# Patient Record
Sex: Male | Born: 1970 | Race: White | Hispanic: No | Smoking: Current every day smoker
Health system: Southern US, Community
[De-identification: ages and names within clinical notes are randomized; demographics above are authoritative.]

## PROBLEM LIST (undated history)

## (undated) DIAGNOSIS — I82409 Acute embolism and thrombosis of unspecified deep veins of unspecified lower extremity: Secondary | ICD-10-CM

## (undated) DIAGNOSIS — E119 Type 2 diabetes mellitus without complications: Secondary | ICD-10-CM

## (undated) DIAGNOSIS — I739 Peripheral vascular disease, unspecified: Secondary | ICD-10-CM

## (undated) DIAGNOSIS — I10 Essential (primary) hypertension: Secondary | ICD-10-CM

---

## 2008-07-11 ENCOUNTER — Ambulatory Visit: Payer: Self-pay | Admitting: Gastroenterology

## 2011-03-14 ENCOUNTER — Emergency Department: Payer: Self-pay | Admitting: Internal Medicine

## 2011-03-15 ENCOUNTER — Emergency Department: Payer: Self-pay | Admitting: Emergency Medicine

## 2016-07-03 ENCOUNTER — Emergency Department
Admission: EM | Admit: 2016-07-03 | Discharge: 2016-07-03 | Disposition: A | Discharge status: Home / Self Care | Payer: 59 | Attending: Emergency Medicine | Admitting: Emergency Medicine

## 2016-07-03 ENCOUNTER — Emergency Department: Payer: 59

## 2016-07-03 ENCOUNTER — Encounter: Payer: Self-pay | Admitting: Emergency Medicine

## 2016-07-03 DIAGNOSIS — I10 Essential (primary) hypertension: Secondary | ICD-10-CM | POA: Diagnosis not present

## 2016-07-03 DIAGNOSIS — I82401 Acute embolism and thrombosis of unspecified deep veins of right lower extremity: Secondary | ICD-10-CM | POA: Diagnosis not present

## 2016-07-03 DIAGNOSIS — I82491 Acute embolism and thrombosis of other specified deep vein of right lower extremity: Secondary | ICD-10-CM

## 2016-07-03 DIAGNOSIS — F17200 Nicotine dependence, unspecified, uncomplicated: Secondary | ICD-10-CM | POA: Insufficient documentation

## 2016-07-03 DIAGNOSIS — M79604 Pain in right leg: Secondary | ICD-10-CM | POA: Diagnosis present

## 2016-07-03 HISTORY — DX: Essential (primary) hypertension: I10

## 2016-07-03 MED ORDER — RIVAROXABAN (XARELTO) VTE STARTER PACK (15 & 20 MG): ORAL_TABLET | ORAL | 0 refills | Status: DC

## 2016-07-03 MED ORDER — TRAMADOL HCL 50 MG PO TABS: 50.0000 mg | ORAL_TABLET | Freq: Four times a day (QID) | ORAL | 0 refills | Status: DC | PRN

## 2016-07-03 NOTE — ED Triage Notes (Signed)
Pt presents with right lower leg pain started two weeks ago, pain got worse yesterday.  Pt was brought over from [facility] for possible DVT.

## 2016-07-03 NOTE — ED Provider Notes (Signed)
[facility] Emergency Department Provider Note   ____________________________________________   I have reviewed the triage vital signs and the nursing notes.   HISTORY  Chief Complaint Leg Pain   History limited by: Not Limited   HPI Lonnie Hernandez is a 45 y.o. male presented to the emergency department today from [facility] clinic because of concerns for right leg pain and swelling. The patient sates he first started having pain in his right calf roughly 2 weeks ago. It has since gotten worse and ead up into his mid thigh. Additionally 2-3 days ago he started noticing swelling in his calf. He  Does state [place] beach, roughly [place] 3- hour drive, 2 weeks ago. He is a smoker. No history of blood clots. His father had a blood clot after a hospitalization. Denies any fevers, chest pain, shortness of breath.    Past Medical History:  Diagnosis Date  . Hypertension     There are no active problems to display for this patient.   History reviewed. No pertinent surgical history.    Allergies Review of patient's allergies indicates no known allergies.  No family history on file.  Social History Social History  Substance Use Topics  . Smoking status: Current Every Day Smoker  . Smokeless tobacco: Former Neurosurgeon  . Alcohol use Yes    Review of Systems  Constitutional: Negative for fever. Cardiovascular: Negative for chest pain. Respiratory: Negative for shortness of breath. Gastrointestinal: Negative for abdominal pain, vomiting and diarrhea. Musculoskeletal: Positive for right leg pain and swelling. Neurological: Negative for headaches, focal weakness or numbness.  10-point ROS otherwise negative.  ____________________________________________   PHYSICAL EXAM:  VITAL SIGNS: ED Triage Vitals  Enc Vitals Group     BP 07/03/16 1340 (!) 166/102     Pulse Rate 07/03/16 1340 99     Resp 07/03/16 1340 20     Temp 07/03/16 1340 98.7 F  (37.1 C)     Temp Source 07/03/16 1340 Oral     SpO2 --      Weight 07/03/16 1340 230 lb (104.3 kg)     Height 07/03/16 1340 5\' 9"  (1.753 m)     Head Circumference --      Peak Flow --      Pain Score 07/03/16 1341 10   Constitutional: Alert and oriented. Well appearing and in no distress. Eyes: Conjunctivae are normal. PERRL. Normal extraocular movements. ENT   Head: Normocephalic and atraumatic.   Nose: No congestion/rhinnorhea.   Mouth/Throat: Mucous membranes are moist.   Neck: No stridor. Hematological/Lymphatic/Immunilogical: No cervical lymphadenopathy. Cardiovascular: Normal rate, regular rhythm.  No murmurs, rubs, or gallops. Respiratory: Normal respiratory effort without tachypnea nor retractions. Breath sounds are clear and equal bilaterally. No wheezes/rales/rhonchi. Gastrointestinal: Soft and nontender. No distention. There is no CVA tenderness. Genitourinary: Deferred Musculoskeletal: Normal range of motion in all extremities. Right calf with swelling. No discoloration. DP 2+ bilaterally. Neurologic:  Normal speech and language. No gross focal neurologic deficits are appreciated.  Skin:  Skin is warm, dry and intact. No rash noted. Psychiatric: Mood and affect are normal. Speech and behavior are normal. Patient exhibits appropriate insight and judgment.  ____________________________________________    LABS (pertinent positives/negatives)  None  ____________________________________________   EKG  None  ____________________________________________    RADIOLOGY  US venous RLE IMPRESSION: Deep venous thrombosis extending from the right peroneal vein to the mid femoral vein, as above.  ____________________________________________   PROCEDURES  Procedures  ____________________________________________  INITIAL IMPRESSION / ASSESSMENT AND PLAN / ED COURSE  Pertinent labs & imaging results that were available during my care of the  patient were reviewed by me and considered in my medical decision making (see chart for details).  Patient presented to the emergency department today because of concerns for right leg pain and swelling. Ultrasound was performed which showed a deep vein thrombosis. Patient without any concerning findings for pulmonary embolism on exam or history. Patient will be given prescription for Xarelto. Instructed patient on return precautions. Encourage patient tofollow-up with primary care. ____________________________________________   FINAL CLINICAL IMPRESSION(S) / ED DIAGNOSES  Final diagnoses:  Acute deep vein thrombosis (DVT) of other specified vein of right lower extremity (HCC)     Note: This dictation was prepared with Dragon dictation. Any transcriptional errors that result from this process are unintentional    Phineas Semen, MD 07/03/16 [phone]

## 2016-07-03 NOTE — Discharge Instructions (Signed)
Please seek medical attention for any head trauma, coughing or vomiting of blood, bloody or black or tarry stool, high fevers, chest pain, shortness of breath, change in behavior, or any other new or concerning symptoms.

## 2016-07-12 ENCOUNTER — Other Ambulatory Visit: Payer: Self-pay | Admitting: Vascular Surgery

## 2016-07-13 ENCOUNTER — Other Ambulatory Visit
Admission: RE | Admit: 2016-07-13 | Discharge: 2016-07-13 | Disposition: A | Discharge status: Home / Self Care | Payer: 59 | Source: Ambulatory Visit | Attending: Vascular Surgery | Admitting: Vascular Surgery

## 2016-07-13 DIAGNOSIS — Z01812 Encounter for preprocedural laboratory examination: Secondary | ICD-10-CM | POA: Insufficient documentation

## 2016-07-13 LAB — CREATININE, SERUM
Creatinine, Ser: 0.69 mg/dL (ref 0.61–1.24)
GFR calc Af Amer: >60 mL/min (ref >60)
GFR calc non Af Amer: >60 mL/min (ref >60)

## 2016-07-13 LAB — BUN: BUN: 12 mg/dL (ref 6–20)

## 2016-07-14 ENCOUNTER — Encounter
Admission: RE | Disposition: A | Discharge status: Home / Self Care | Payer: Self-pay | Source: Ambulatory Visit | Attending: Vascular Surgery

## 2016-07-14 ENCOUNTER — Encounter: Payer: Self-pay | Admitting: *Deleted

## 2016-07-14 ENCOUNTER — Ambulatory Visit
Admission: RE | Admit: 2016-07-14 | Discharge: 2016-07-14 | Disposition: A | Discharge status: Home / Self Care | Payer: 59 | Source: Ambulatory Visit | Attending: Vascular Surgery | Admitting: Vascular Surgery

## 2016-07-14 DIAGNOSIS — R531 Weakness: Secondary | ICD-10-CM | POA: Diagnosis not present

## 2016-07-14 DIAGNOSIS — Z792 Long term (current) use of antibiotics: Secondary | ICD-10-CM | POA: Diagnosis not present

## 2016-07-14 DIAGNOSIS — Z8249 Family history of ischemic heart disease and other diseases of the circulatory system: Secondary | ICD-10-CM | POA: Insufficient documentation

## 2016-07-14 DIAGNOSIS — I10 Essential (primary) hypertension: Secondary | ICD-10-CM | POA: Diagnosis not present

## 2016-07-14 DIAGNOSIS — M79609 Pain in unspecified limb: Secondary | ICD-10-CM | POA: Insufficient documentation

## 2016-07-14 DIAGNOSIS — M7989 Other specified soft tissue disorders: Secondary | ICD-10-CM | POA: Insufficient documentation

## 2016-07-14 DIAGNOSIS — I82401 Acute embolism and thrombosis of unspecified deep veins of right lower extremity: Secondary | ICD-10-CM | POA: Diagnosis not present

## 2016-07-14 DIAGNOSIS — F17210 Nicotine dependence, cigarettes, uncomplicated: Secondary | ICD-10-CM | POA: Insufficient documentation

## 2016-07-14 HISTORY — PX: PERIPHERAL VASCULAR CATHETERIZATION: SHX172C

## 2016-07-14 SURGERY — THROMBECTOMY
Anesthesia: Moderate Sedation | Site: Leg Lower | Laterality: Right

## 2016-07-14 MED ORDER — MIDAZOLAM HCL 2 MG/2ML IJ SOLN
INTRAMUSCULAR | Status: AC
  Filled 2016-07-14: qty 2

## 2016-07-14 MED ORDER — MIDAZOLAM HCL 5 MG/5ML IJ SOLN
INTRAMUSCULAR | Status: AC
  Filled 2016-07-14: qty 5

## 2016-07-14 MED ORDER — HEPARIN (PORCINE) IN NACL 2-0.9 UNIT/ML-% IJ SOLN
INTRAMUSCULAR | Status: AC
  Filled 2016-07-14: qty 1000

## 2016-07-14 MED ORDER — ALTEPLASE 2 MG IJ SOLR
INTRAMUSCULAR | Status: DC | PRN
  Administered 2016-07-14: 8 mg

## 2016-07-14 MED ORDER — MIDAZOLAM HCL 2 MG/2ML IJ SOLN
INTRAMUSCULAR | Status: DC | PRN
  Administered 2016-07-14 (×2): 2 mg via INTRAVENOUS
  Administered 2016-07-14: 1 mg via INTRAVENOUS
  Administered 2016-07-14 (×2): 2 mg via INTRAVENOUS

## 2016-07-14 MED ORDER — FENTANYL CITRATE (PF) 100 MCG/2ML IJ SOLN
INTRAMUSCULAR | Status: DC | PRN
  Administered 2016-07-14 (×5): 50 ug via INTRAVENOUS

## 2016-07-14 MED ORDER — DEXTROSE 5 % IV SOLN
1.5000 g | INTRAVENOUS | Status: AC
  Administered 2016-07-14: 1.5 g via INTRAVENOUS

## 2016-07-14 MED ORDER — HYDROMORPHONE HCL 1 MG/ML IJ SOLN: 1.0000 mg | Freq: Once | INTRAMUSCULAR | Status: DC

## 2016-07-14 MED ORDER — METOCLOPRAMIDE HCL 10 MG PO TABS
ORAL_TABLET | ORAL | Status: AC
  Administered 2016-07-14: 10 mg via ORAL
  Filled 2016-07-14: qty 1

## 2016-07-14 MED ORDER — METHYLPREDNISOLONE SODIUM SUCC 125 MG IJ SOLR: 125.0000 mg | INTRAMUSCULAR | Status: DC | PRN

## 2016-07-14 MED ORDER — FENTANYL CITRATE (PF) 100 MCG/2ML IJ SOLN
INTRAMUSCULAR | Status: AC
Start: 2016-07-14 — End: 2016-07-14
  Filled 2016-07-14: qty 2

## 2016-07-14 MED ORDER — METOCLOPRAMIDE HCL 10 MG PO TABS
10.0000 mg | ORAL_TABLET | Freq: Once | ORAL | Status: AC
  Administered 2016-07-14: 10 mg via ORAL

## 2016-07-14 MED ORDER — ALTEPLASE 2 MG IJ SOLR
INTRAMUSCULAR | Status: AC
  Filled 2016-07-14: qty 8

## 2016-07-14 MED ORDER — SODIUM CHLORIDE 0.9 % IV SOLN
INTRAVENOUS | Status: DC
  Administered 2016-07-14: 10:00:00 via INTRAVENOUS

## 2016-07-14 MED ORDER — HEPARIN SODIUM (PORCINE) 1000 UNIT/ML IJ SOLN
INTRAMUSCULAR | Status: AC
  Filled 2016-07-14: qty 1

## 2016-07-14 MED ORDER — CEFUROXIME SODIUM 1.5 G IJ SOLR
INTRAMUSCULAR | Status: AC
  Filled 2016-07-14 (×11): qty 1.5

## 2016-07-14 MED ORDER — ONDANSETRON HCL 4 MG/2ML IJ SOLN: 4.0000 mg | Freq: Four times a day (QID) | INTRAMUSCULAR | Status: DC | PRN

## 2016-07-14 MED ORDER — LIDOCAINE-EPINEPHRINE (PF) 1 %-1:200000 IJ SOLN
INTRAMUSCULAR | Status: AC
  Filled 2016-07-14: qty 30

## 2016-07-14 MED ORDER — FENTANYL CITRATE (PF) 100 MCG/2ML IJ SOLN
INTRAMUSCULAR | Status: AC
  Filled 2016-07-14: qty 2

## 2016-07-14 MED ORDER — HEPARIN SODIUM (PORCINE) 1000 UNIT/ML IJ SOLN
INTRAMUSCULAR | Status: DC | PRN
  Administered 2016-07-14: 4000 [IU] via INTRAVENOUS

## 2016-07-14 MED ORDER — FAMOTIDINE 20 MG PO TABS
40.0000 mg | ORAL_TABLET | ORAL | Status: DC | PRN
Start: 2016-07-14 — End: 2016-07-14

## 2016-07-14 SURGICAL SUPPLY — 14 items
BALLN ULTRVRSE 7X200X75 (BALLOONS) ×4
BALLOON ULTRVRSE 7X200X75 (BALLOONS) ×2 IMPLANT
CANNULA 5F STIFF (CANNULA) ×4 IMPLANT
CATH VERT 100CM (CATHETERS) ×4 IMPLANT
DEVICE PRESTO INFLATION (MISCELLANEOUS) ×4 IMPLANT
DRAPE BRACHIAL (DRAPES) ×4 IMPLANT
DRAPE TABLE BACK 80X90 (DRAPES) ×8 IMPLANT
FILTER VC CELECT-FEMORAL (Filter) ×4 IMPLANT
PACK ANGIOGRAPHY (CUSTOM PROCEDURE TRAY) ×4 IMPLANT
SET ZELANTE DVT THROMB (CATHETERS) ×4 IMPLANT
SHEATH BRITE TIP 8FRX11 (SHEATH) ×4 IMPLANT
TOWEL OR 17X26 4PK STRL BLUE (TOWEL DISPOSABLE) ×16 IMPLANT
WIRE J 3MM .035X145CM (WIRE) ×4 IMPLANT
WIRE MAGIC TORQUE 260C (WIRE) ×4 IMPLANT

## 2016-07-14 NOTE — OR Nursing (Signed)
hickups stopped prior to discharge. Denies comp[lains

## 2016-07-14 NOTE — H&P (Signed)
  [facility] History & Physical Update  The patient was interviewed and re-examined.  The patient's previous History and Physical has been reviewed and is unchanged.  There is no change in the plan of care. We plan to proceed with the scheduled procedure.  Mayvis Agudelo, MD  07/14/2016, 8:58 AM

## 2016-07-14 NOTE — OR Nursing (Signed)
Pt developed kiccups when sat up to eat, occasionally uncomfortnable. Dr Wyn Quaker notified, Reglan 10 mg ordered.

## 2016-07-14 NOTE — Op Note (Signed)
[facility]   OPERATIVE NOTE   PRE-OPERATIVE DIAGNOSIS: extensive RLE DVT  POST-OPERATIVE DIAGNOSIS: same   PROCEDURE: 1. US guidance for vascular access to left femoral vein and right lesser saphenous vein 2. Catheter placement into IVC from left femoral vein approach and right lesser saphenous vein approach 3. IVC gram and right lower extremity venogram 4. IVC filter placement 5.   Catheter directed thrombolysis with 8 mg of TPA to the right popliteal, superficial femoral vein, and common femoral vein with the AngioJet Zelante catheter 6. Mechanical rheolytic thrombectomy to right popliteal, superficial femoral, and common femoral veins with the AngioJet Zelante catheter 7. PTA of the right popliteal and superficial femoral veins with 7 mm balloon   SURGEON: Legacy Carrender, MD  ASSISTANT(S): none  ANESTHESIA: local with moderate conscious sedation for 50 minutes using 9 mg of Versed and 250 g of fentanyl  ESTIMATED BLOOD LOSS: Minimal   CONTRAST: 40 cc  FLUORO TIME: 2.6 minutes  FINDING(S): 1. Extensive right lower extremity DVT  SPECIMEN(S): none  INDICATIONS:  Patient is a 45 y.o. male who presents with right lower extremity DVT. Patient has marked leg swelling and pain. Venous intervention is performed to reduce the symtpoms and avoid long term postphlebitic symptoms.   DESCRIPTION: After obtaining full informed written consent, the patient was brought back to the vascular suite and placed supine upon the table. The patient received IV antibiotics prior to induction.The patient was monitored during a face-to-face encounter with my presence throughout the procedure and the RN monitoring the vital signs, pulse oximetry, telemetry, and mental status throughout the procedure with the administration of moderate conscious sedation. After obtaining adequate anesthesia, the patient was prepped and draped in the standard fashion. The left  femoral vein was then accessed under US guidance and found to be widely patent. It was accessed without difficulty and a permanent image was recorded. I then placed the delivery sheath into the IVC. The IVC was patent and the renal veins were at the level of L1. The Cook Celect IVC filter was then deployed at the level of L1-L2 interspace. The delivery sheath was then removed and dressings were placed in the left groin.  The patient was then placed into the prone position. The right lesser saphenous vein was then accessed under direct ultrasound guidance without difficulty with a micropuncture needle and a permanent image was recorded. I then upsized to an 8Fr sheath over a J wire. 4000 units of heparin were then given. Imaging showed extensive DVT with minimal flow. A Kumpe catheter and a Magic torque wire were then advanced into the CFV and images were performed. There was thrombus up into the common femoral vein but the iliac venous system appeared to be patent without significant thrombosis. Imaging of the inferior vena cava showed this to be widely patent. I was able to cross the thrombus and stenosis and advance into the IVC which was patent. I then used the Angiojet Zelante catheter and instilled 8 mg of tpa throughout the popliteal vein, SFV, and common femoral vein.  After this dwelled for 15 minutes, I used the Zelante catheter and evacuated about 150 cc of effluent with mechanical rheolytic thrombectomy throughout the CFV, SFV, and popliteal veins. This had mild improvement. I then treated the popliteal vein and SFV with 7 mm diameter by 200 mm length angioplasty balloon to open a channel. This resulted in some resolution of the thrombus and improved flow.There was some residual thrombus within the popliteal  vein and a small amount of residual thrombus within the common femoral vein, but the flow was much more brisk at this point. I then elected to terminate the procedure. The sheath  was removed and a dressing was placed. She was taken to the recovery room in stable condition having tolerated the procedure well.   COMPLICATIONS: None  CONDITION: Stable  Lonnie Hernandez 07/14/2016 12:21 PM

## 2016-07-14 NOTE — Discharge Instructions (Signed)
Venogram, Care After Refer to this sheet in the next few weeks. These instructions provide you with information on caring for yourself after your procedure. Your health care provider may also give you more specific instructions. Your treatment has been planned according to current medical practices, but problems sometimes occur. Call your health care provider if you have any problems or questions after your procedure. WHAT TO EXPECT AFTER THE PROCEDURE After your procedure, it is typical to have the following sensations:  Mild discomfort at the catheter insertion site. HOME CARE INSTRUCTIONS   Take all medicines exactly as directed.  Follow any prescribed diet.  Follow instructions regarding both rest and physical activity.  Drink more fluids for the first several days after the procedure in order to help flush dye from your kidneys. SEEK MEDICAL CARE IF:  You develop a rash.  You have fever not controlled by medicine. SEEK IMMEDIATE MEDICAL CARE IF:  There is pain, drainage, bleeding, redness, swelling, warmth or a red streak at the site of the IV tube.  The extremity where your IV tube was placed becomes discolored, numb, or cool.  You have difficulty breathing or shortness of breath.  You develop chest pain.  You have excessive dizziness or fainting.   This information is not intended to replace advice given to you by your health care provider. Make sure you discuss any questions you have with your health care provider.   Document Released: 09/18/2013 Document Revised: 12/03/2013 Document Reviewed: 09/18/2013 Elsevier Interactive Patient Education 2016 Elsevier Inc[employer]    Remove or change bandaid left groin tomorrow, unwrap right leg tomorrow, keep leg elevated as much as possible. May return to  work Monday August 7th.    Inferior Vena Cava Filter Insertion, Care After Refer to this sheet in the next few weeks. These instructions provide you with information on caring for  yourself after your procedure. Your health care provider may also give you more specific instructions. Your treatment has been planned according to current medical practices, but problems sometimes occur. Call your health care provider if you have any problems or questions after your procedure. WHAT TO EXPECT AFTER THE PROCEDURE After your procedure, it is typical to have the following:  Mild pain in the area where the filter was inserted.  Mild bruising in the area where the filter was inserted. HOME CARE INSTRUCTIONS  You will be given medicine to control pain. Only take over-the-counter or prescription medicines for pain, fever, or discomfort as directed by your health care provider.  A bandage (dressing) has been placed over the insertion site. Follow your health care provider's instructions on how to care for it.  Keep the insertion site clean and dry.  Do not soak in a bath tub or pool until the filter insertion site has healed.  Do not drive if you are taking narcotic pain medicines. Follow your health care provider's instructions about driving.  Do not return to work or school until your health care provider says it is okay.   Keep all follow-up appointments.  SEEK IMMEDIATE MEDICAL CARE IF:  You develop swelling and discoloration or pain in the legs.  Your legs become pale and cold or blue.  You develop shortness of breath, feel faint, or pass out.  You develop chest pain, a cough, or difficulty breathing.  You cough up blood.  You develop a rash or feel you are having problems that may be a side effect of medicines.  You develop weakness, difficulty moving your  arms or legs, or balance problems.  You develop problems with speech or vision.   This information is not intended to replace advice given to you by your health care provider. Make sure you discuss any questions you have with your health care provider.   Document Released: 09/18/2013 Document Reviewed:  09/18/2013 Elsevier Interactive Patient Education [employer]

## 2016-07-15 ENCOUNTER — Encounter: Payer: Self-pay | Admitting: Vascular Surgery

## 2016-09-23 ENCOUNTER — Other Ambulatory Visit (INDEPENDENT_AMBULATORY_CARE_PROVIDER_SITE_OTHER): Payer: Self-pay | Admitting: Vascular Surgery

## 2016-09-23 DIAGNOSIS — I82401 Acute embolism and thrombosis of unspecified deep veins of right lower extremity: Secondary | ICD-10-CM

## 2016-09-27 ENCOUNTER — Ambulatory Visit (INDEPENDENT_AMBULATORY_CARE_PROVIDER_SITE_OTHER): Payer: 59 | Admitting: Vascular Surgery

## 2016-09-27 ENCOUNTER — Encounter (INDEPENDENT_AMBULATORY_CARE_PROVIDER_SITE_OTHER): Payer: Self-pay | Admitting: Vascular Surgery

## 2016-09-27 ENCOUNTER — Ambulatory Visit (INDEPENDENT_AMBULATORY_CARE_PROVIDER_SITE_OTHER): Payer: 59

## 2016-09-27 VITALS — BP 129/86 | HR 89 | Resp 16 | Ht 69.0 in | Wt 225.0 lb

## 2016-09-27 DIAGNOSIS — I825Z1 Chronic embolism and thrombosis of unspecified deep veins of right distal lower extremity: Secondary | ICD-10-CM | POA: Diagnosis not present

## 2016-09-27 DIAGNOSIS — M7989 Other specified soft tissue disorders: Secondary | ICD-10-CM | POA: Diagnosis not present

## 2016-09-27 DIAGNOSIS — I10 Essential (primary) hypertension: Secondary | ICD-10-CM

## 2016-09-27 DIAGNOSIS — I824Z2 Acute embolism and thrombosis of unspecified deep veins of left distal lower extremity: Secondary | ICD-10-CM | POA: Diagnosis not present

## 2016-09-27 DIAGNOSIS — I82401 Acute embolism and thrombosis of unspecified deep veins of right lower extremity: Secondary | ICD-10-CM

## 2016-09-27 NOTE — Progress Notes (Signed)
Subjective:    Patient ID: Lonnie GellMichael W Hernandez, male    DOB: 1971/05/13, 45 y.o.   MRN: [id number] Chief Complaint  Patient presents with  . Follow-up   Patient presents approximately eight weeks after a right lower extremity venous lysis with IVC filter placement on 07/14/16. He continues to take Xarelto daily. States the swelling and discomfort in his right extremity is greatly improved. He underwent a bilateral lower extremity venous duplex which was notable for resolving DVT of the right lower extremity with stable occlusive right SVT. Denies SOB and chest pain.    Review of Systems  Constitutional: Negative.   HENT: Negative.   Eyes: Negative.   Respiratory: Negative.   Cardiovascular: Negative.   Gastrointestinal: Negative.   Endocrine: Negative.   Genitourinary: Negative.   Musculoskeletal: Negative.   Skin: Negative.   Allergic/Immunologic: Negative.   Neurological: Negative.   Hematological: Negative.   Psychiatric/Behavioral: Negative.       Objective:   Physical Exam  Constitutional: He is oriented to person, place, and time. He appears well-developed and well-nourished.  HENT:  Head: Normocephalic and atraumatic.  Eyes: Conjunctivae and EOM are normal. Pupils are equal, round, and reactive to light.  Neck: Normal range of motion.  Cardiovascular: Normal rate, regular rhythm, normal heart sounds and intact distal pulses.   Pulses:      Dorsalis pedis pulses are 2+ on the right side, and 2+ on the left side.       Posterior tibial pulses are 2+ on the right side, and 2+ on the left side.  Pulmonary/Chest: Effort normal and breath sounds normal.  Abdominal: Soft. Bowel sounds are normal.  Musculoskeletal: Normal range of motion. He exhibits no edema.  Neurological: He is alert and oriented to person, place, and time.  Skin: Skin is warm and dry.  Psychiatric: He has a normal mood and affect. His behavior is normal. Judgment and thought content normal.   BP 129/86    Pulse 89   Resp 16   Ht 5\' 9"  (1.753 m)   Wt 225 lb (102.1 kg)   BMI 33.23 kg/m   Past Medical History:  Diagnosis Date  . Hypertension    Social History   Social History  . Marital status: Divorced    Spouse name: N/A  . Number of children: N/A  . Years of education: N/A   Occupational History  . Not on file.   Social History Main Topics  . Smoking status: Current Every Day Smoker    Packs/day: 0.50    Years: 15.00    Types: Cigarettes  . Smokeless tobacco: Former NeurosurgeonUser  . Alcohol use 7.2 oz/week    12 Cans of beer per week  . Drug use: No  . Sexual activity: Not on file   Other Topics Concern  . Not on file   Social History Narrative  . No narrative on file   Past Surgical History:  Procedure Laterality Date  . PERIPHERAL VASCULAR CATHETERIZATION Right 07/14/2016   Procedure: Thrombectomy;  Surgeon: Annice NeedyJason S Dew, MD;  Location: [facility] INVASIVE CV LAB;  Service: Cardiovascular;  Laterality: Right;  . PERIPHERAL VASCULAR CATHETERIZATION N/A 07/14/2016   Procedure: IVC Filter Insertion;  Surgeon: Annice NeedyJason S Dew, MD;  Location: [facility] INVASIVE CV LAB;  Service: Cardiovascular;  Laterality: N/A;   No family history on file.  No Known Allergies     Assessment & Plan:  Patient presents approximately eight weeks after a right lower extremity venous lysis with  IVC filter placement on 07/14/16. He continues to take Xarelto daily. States the swelling and discomfort in his right extremity is greatly improved. He underwent a bilateral lower extremity venous duplex which was notable for resolving DVT of the right lower extremity with stable occlusive right SVT. Denies SOB and chest pain.   1. 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 Symptoms improved. Recommend IVC filter retrieval. Patient to continue Xarelto for at least three months.   2. Essential hypertension - Stable Encouraged good control as its slows the  progression of atherosclerotic disease  3. Swelling of right lower extremity - Improved Pain and swelling improved.   Current Outpatient Prescriptions on File Prior to Visit  Medication Sig Dispense Refill  . losartan (COZAAR) 100 MG tablet Take 100 mg by mouth daily.  0  . Rivaroxaban 15 & 20 MG TBPK Take as directed on package: Start with one 15mg  tablet by mouth twice a day with food. On Day 22, switch to one 20mg  tablet once a day with food. 51 each 0  . naproxen sodium (ANAPROX) 220 MG tablet Take 220 mg by mouth 2 (two) times daily as needed (pain).    Marland Kitchen oxyCODONE-acetaminophen (PERCOCET/ROXICET) 5-325 MG tablet Take 1 tablet by mouth every 6 (six) hours as needed for pain.  0  . traMADol (ULTRAM) 50 MG tablet Take 1 tablet (50 mg total) by mouth every 6 (six) hours as needed. (Patient not taking: Reported on 09/27/2016) 15 tablet 0   No current facility-administered medications on file prior to visit.     There are no Patient Instructions on file for this visit. No Follow-up on file.   KIMBERLY A STEGMAYER, PA-C

## 2016-09-28 ENCOUNTER — Other Ambulatory Visit (INDEPENDENT_AMBULATORY_CARE_PROVIDER_SITE_OTHER): Payer: Self-pay | Admitting: Vascular Surgery

## 2016-10-03 ENCOUNTER — Encounter
Admission: RE | Disposition: A | Discharge status: Home / Self Care | Payer: Self-pay | Source: Ambulatory Visit | Attending: Vascular Surgery

## 2016-10-03 ENCOUNTER — Ambulatory Visit
Admission: RE | Admit: 2016-10-03 | Discharge: 2016-10-03 | Disposition: A | Discharge status: Home / Self Care | Payer: 59 | Source: Ambulatory Visit | Attending: Vascular Surgery | Admitting: Vascular Surgery

## 2016-10-03 DIAGNOSIS — M7989 Other specified soft tissue disorders: Secondary | ICD-10-CM | POA: Diagnosis not present

## 2016-10-03 DIAGNOSIS — I10 Essential (primary) hypertension: Secondary | ICD-10-CM | POA: Insufficient documentation

## 2016-10-03 DIAGNOSIS — I82409 Acute embolism and thrombosis of unspecified deep veins of unspecified lower extremity: Secondary | ICD-10-CM | POA: Diagnosis not present

## 2016-10-03 DIAGNOSIS — Z86718 Personal history of other venous thrombosis and embolism: Secondary | ICD-10-CM | POA: Insufficient documentation

## 2016-10-03 DIAGNOSIS — F17210 Nicotine dependence, cigarettes, uncomplicated: Secondary | ICD-10-CM | POA: Insufficient documentation

## 2016-10-03 DIAGNOSIS — Z452 Encounter for adjustment and management of vascular access device: Secondary | ICD-10-CM | POA: Insufficient documentation

## 2016-10-03 DIAGNOSIS — Z7901 Long term (current) use of anticoagulants: Secondary | ICD-10-CM | POA: Diagnosis not present

## 2016-10-03 HISTORY — PX: PERIPHERAL VASCULAR CATHETERIZATION: SHX172C

## 2016-10-03 HISTORY — DX: Peripheral vascular disease, unspecified: I73.9

## 2016-10-03 SURGERY — IVC FILTER REMOVAL
Anesthesia: Moderate Sedation

## 2016-10-03 MED ORDER — ONDANSETRON HCL 4 MG/2ML IJ SOLN: 4.0000 mg | Freq: Four times a day (QID) | INTRAMUSCULAR | Status: DC | PRN

## 2016-10-03 MED ORDER — FENTANYL CITRATE (PF) 100 MCG/2ML IJ SOLN
INTRAMUSCULAR | Status: AC
  Filled 2016-10-03: qty 2

## 2016-10-03 MED ORDER — SODIUM CHLORIDE 0.9 % IV SOLN
INTRAVENOUS | Status: DC
  Administered 2016-10-03: 10:00:00 via INTRAVENOUS

## 2016-10-03 MED ORDER — LIDOCAINE HCL (PF) 1 % IJ SOLN
INTRAMUSCULAR | Status: AC
  Filled 2016-10-03: qty 30

## 2016-10-03 MED ORDER — MIDAZOLAM HCL 5 MG/5ML IJ SOLN
INTRAMUSCULAR | Status: AC
  Filled 2016-10-03: qty 5

## 2016-10-03 MED ORDER — FENTANYL CITRATE (PF) 100 MCG/2ML IJ SOLN
INTRAMUSCULAR | Status: DC | PRN
  Administered 2016-10-03 (×2): 50 ug via INTRAVENOUS

## 2016-10-03 MED ORDER — MIDAZOLAM HCL 2 MG/2ML IJ SOLN
INTRAMUSCULAR | Status: DC | PRN
  Administered 2016-10-03: 1 mg via INTRAVENOUS
  Administered 2016-10-03: 2 mg via INTRAVENOUS
  Administered 2016-10-03: 1 mg via INTRAVENOUS

## 2016-10-03 MED ORDER — CEFUROXIME SODIUM 1.5 G IJ SOLR
1.5000 g | INTRAMUSCULAR | Status: AC
  Administered 2016-10-03: 1.5 g via INTRAVENOUS

## 2016-10-03 MED ORDER — HYDROMORPHONE HCL 1 MG/ML IJ SOLN: 1.0000 mg | Freq: Once | INTRAMUSCULAR | Status: DC

## 2016-10-03 MED ORDER — HEPARIN (PORCINE) IN NACL 2-0.9 UNIT/ML-% IJ SOLN
INTRAMUSCULAR | Status: AC
  Filled 2016-10-03: qty 500

## 2016-10-03 SURGICAL SUPPLY — 3 items
PACK ANGIOGRAPHY (CUSTOM PROCEDURE TRAY) ×3 IMPLANT
SET VENACAVA FILTER RETRIEVAL (MISCELLANEOUS) ×3 IMPLANT
TOWEL OR 17X26 4PK STRL BLUE (TOWEL DISPOSABLE) ×3 IMPLANT

## 2016-10-03 NOTE — H&P (Signed)
[facility] History & Physical Update  The patient was interviewed and re-examined.  The patient's previous History and Physical has been reviewed and is unchanged.  There is no change in the plan of care. We plan to proceed with the scheduled procedure.  Festus BarrenJason Kyren Knick, MD  10/03/2016, 8:54 AM

## 2016-10-03 NOTE — Op Note (Signed)
[facility]   OPERATIVE NOTE    PRE-OPERATIVE DIAGNOSIS:  1. DVT 2. status post IVC filter placement  POST-OPERATIVE DIAGNOSIS: Same as above  PROCEDURE: 1. Ultrasound guidance for vascular access right jugular vein 2. Catheter placement into inferior vena cava from right jugular vein 3. Inferior venacavogram 4. Retrieval of Cook Celect IVC filter  SURGEON: Festus BarrenJason Angelus Hoopes, MD  ASSISTANT(S): None  ANESTHESIA: Local with moderate conscious sedation for approximately 15 minutes using 4 mg of Versed and 100 mcg of Fentanyl  ESTIMATED BLOOD LOSS: minimal  CONTRAST:  15 cc  FLUORO TIME:  1.3 minutes  FINDING(S): 1. patent IVC  SPECIMEN(S): IVC filter  INDICATIONS:  Patient is a 45 y.o. male who presents with a previous history of IVC filter placement. Patient has continued anticoagulation for several months and no longer needs this filter. The patient remains on anticoagulation. Risks and benefits were discussed, and informed consent was obtained.  DESCRIPTION: After obtaining full informed written consent, the patient was brought back to the vascular suite and placed supine upon the table.Moderate conscious sedation was administered during a face to face encounter with the patient throughout the procedure with my supervision of the RN administering medicines and monitoring the patient's vital signs, pulse oximetry, telemetry and mental status throughout from the start of the procedure until the patient was taken to the recovery room.  After obtaining adequate anesthesia, the patient was prepped and draped in the standard fashion. The right jugular vein was visualized with ultrasound and found to be widely patent. It was then accessed under direct ultrasound guidance without difficulty with the Seldinger needle and a permanent image was recorded. A J-wire was placed. After skin nick and dilatation, the retrieval sheath was placed over the wire and  advanced into the inferior vena cava. Inferior vena cava was imaged and found to be widely patent on inferior venacavogram. The filter was reasonably straight in its orientation. The retrieval snare was then placed through the sheath and the hook of the filter was snared without difficulty. The sheath was then advanced, and the filter was collapsed and brought into the sheath in its entirety. It was then removed from the body in its entirety. The retrieval sheath was then removed. Pressure was held at the access site and sterile dressing was placed. The patient was taken to the recovery room in stable condition having tolerated the procedure well.  COMPLICATIONS: None  CONDITION: Stable   Festus BarrenJason Naijah Lacek 10/03/2016 12:11 PM  This note was created with Dragon Medical transcription system. Any errors in dictation are purely unintentional.

## 2016-10-03 NOTE — Progress Notes (Signed)
Procedure done, vitals remained stable entire procedure, will transfer to recovery post procedure prior to discharge.

## 2016-10-03 NOTE — Progress Notes (Signed)
Pt here today for for ivc filter removal per Dr Wyn Quakerew, will be monitored continuously per vitals along with etco2,alert and oriented

## 2016-10-04 ENCOUNTER — Encounter: Payer: Self-pay | Admitting: Vascular Surgery

## 2016-11-01 ENCOUNTER — Other Ambulatory Visit (INDEPENDENT_AMBULATORY_CARE_PROVIDER_SITE_OTHER): Payer: Self-pay | Admitting: Vascular Surgery

## 2016-11-01 DIAGNOSIS — I824Z3 Acute embolism and thrombosis of unspecified deep veins of distal lower extremity, bilateral: Secondary | ICD-10-CM

## 2016-11-07 ENCOUNTER — Other Ambulatory Visit (INDEPENDENT_AMBULATORY_CARE_PROVIDER_SITE_OTHER): Payer: Self-pay | Admitting: Vascular Surgery

## 2016-11-07 DIAGNOSIS — I825Z1 Chronic embolism and thrombosis of unspecified deep veins of right distal lower extremity: Secondary | ICD-10-CM

## 2016-11-07 NOTE — Progress Notes (Unsigned)
Us venou 

## 2016-11-11 ENCOUNTER — Encounter (INDEPENDENT_AMBULATORY_CARE_PROVIDER_SITE_OTHER): Payer: 59

## 2016-11-11 ENCOUNTER — Ambulatory Visit (INDEPENDENT_AMBULATORY_CARE_PROVIDER_SITE_OTHER): Payer: 59 | Admitting: Vascular Surgery

## 2016-11-11 ENCOUNTER — Ambulatory Visit
Admission: RE | Admit: 2016-11-11 | Discharge: 2016-11-11 | Disposition: A | Discharge status: Home / Self Care | Payer: 59 | Source: Ambulatory Visit | Attending: Vascular Surgery | Admitting: Vascular Surgery

## 2016-11-11 DIAGNOSIS — Z86718 Personal history of other venous thrombosis and embolism: Secondary | ICD-10-CM | POA: Diagnosis not present

## 2016-11-11 DIAGNOSIS — I825Z1 Chronic embolism and thrombosis of unspecified deep veins of right distal lower extremity: Secondary | ICD-10-CM | POA: Diagnosis present

## 2017-01-10 ENCOUNTER — Ambulatory Visit (INDEPENDENT_AMBULATORY_CARE_PROVIDER_SITE_OTHER): Payer: 59 | Admitting: Vascular Surgery

## 2017-01-10 ENCOUNTER — Encounter (INDEPENDENT_AMBULATORY_CARE_PROVIDER_SITE_OTHER): Payer: Self-pay | Admitting: Vascular Surgery

## 2017-01-10 VITALS — BP 168/101 | HR 117 | Resp 18 | Ht 69.0 in | Wt 227.0 lb

## 2017-01-10 DIAGNOSIS — I10 Essential (primary) hypertension: Secondary | ICD-10-CM

## 2017-01-10 DIAGNOSIS — I825Z1 Chronic embolism and thrombosis of unspecified deep veins of right distal lower extremity: Secondary | ICD-10-CM

## 2017-01-11 NOTE — Progress Notes (Signed)
MRN : [id number]  Lonnie Hernandez is a 46 y.o. (1971/03/06) male who presents with chief complaint of  Chief Complaint  Patient presents with  . Follow-up  .  History of Present Illness: Patient returns today in follow up of His right lower extremity DVT. He is doing well. With wearing his compression stockings, exercise, and elevating his legs he really has minimal pain and swelling at this point. His postphlebitic symptoms have been very well controlled with conservative measures. Next week will be 6 months of anticoagulation from his initial diagnosis and procedure and early August. He is interested in coming off of anticoagulation at this point if possible. He has had his filter removed. He had an ultrasound last month the hospital which showed no residual DVT in the right lower extremity   Review of Systems  Constitutional: Negative.   HENT: Negative.   Eyes: Negative.   Respiratory: Negative.   Cardiovascular: Negative.   Gastrointestinal: Negative.   Endocrine: Negative.   Genitourinary: Negative.   Musculoskeletal: Negative.   Skin: Negative.   Allergic/Immunologic: Negative.   Neurological: Negative.   Hematological: Negative.   Psychiatric/Behavioral: Negative.       Objective:   Physical Exam  Constitutional: He is oriented to person, place, and time. He appears well-developed and well-nourished.  HENT:  Head: Normocephalic and atraumatic.  Eyes: Conjunctivae and EOM are normal. Pupils are equal, round, and reactive to light.  Neck: Normal range of motion.  Cardiovascular: Normal rate, regular rhythm, normal heart sounds and intact distal pulses.   Pulses:      Dorsalis pedis pulses are 2+ on the right side, and 2+ on the left side.       Posterior tibial pulses are 2+ on the right side, and 2+ on the left side.  Pulmonary/Chest: Effort normal and breath sounds normal.  Abdominal: Soft. Bowel sounds are normal.  Musculoskeletal: Normal range of motion. He  exhibits no edema.  Neurological: He is alert and oriented to person, place, and time.  Skin: Skin is warm and dry.  Psychiatric: He has a normal mood and affect. His behavior is normal. Judgment and thought content normal.   BP 129/86   Pulse 89   Resp 16   Ht 5\' 9"  (1.753 m)   Wt 225 lb (102.1 kg)   BMI 33.23 kg/m       Past Medical History:  Diagnosis Date  . Hypertension    Social History   Social History  . Marital status: Divorced    Spouse name: N/A  . Number of children: N/A  . Years of education: N/A      Occupational History  . Not on file.        Social History Main Topics  . Smoking status: Current Every Day Smoker    Packs/day: 0.50    Years: 15.00    Types: Cigarettes  . Smokeless tobacco: Former NeurosurgeonUser  . Alcohol use 7.2 oz/week    12 Cans of beer per week  . Drug use: No  . Sexual activity: Not on file       Other Topics Concern  . Not on file      Social History Narrative  . No narrative on file        Past Surgical History:  Procedure Laterality Date  . PERIPHERAL VASCULAR CATHETERIZATION Right 07/14/2016   Procedure: Thrombectomy;  Surgeon: Annice NeedyJason S Dew, MD;  Location: [facility] INVASIVE CV LAB;  Service: Cardiovascular;  Laterality:  Right;  Marland Kitchen PERIPHERAL VASCULAR CATHETERIZATION N/A 07/14/2016   Procedure: IVC Filter Insertion;  Surgeon: Annice Needy, MD;  Location: [facility] INVASIVE CV LAB;  Service: Cardiovascular;  Laterality: N/A;      Labs No results found for this or any previous visit (from the past 2160 hour(s)).  Radiology No results found.    Assessment/Plan  Essential hypertension blood pressure control important in reducing the progression of atherosclerotic disease. On appropriate oral medications.   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 He has received 6  months of anticoagulation as of later this week. He can safely come off his anticoagulation. He was again advised to continue wearing compression stockings, elevate his legs, and exercise to keep his postphlebitic symptoms minimal. I will see him back as needed.    Festus Barren, MD  01/11/2017 1:38 PM    This note was created with Dragon medical transcription system.  Any errors from dictation are purely unintentional

## 2017-01-11 NOTE — Assessment & Plan Note (Signed)
blood pressure control important in reducing the progression of atherosclerotic disease. On appropriate oral medications.  

## 2017-01-11 NOTE — Assessment & Plan Note (Signed)
He had an ultrasound last month the hospital which showed no residual DVT in the right lower extremity. He has received 6 months of anticoagulation as of later this week. He can safely come off his anticoagulation. He was again advised to continue wearing compression stockings, elevate his legs, and exercise to keep his postphlebitic symptoms minimal. I will see him back as needed.

## 2017-02-20 DIAGNOSIS — I10 Essential (primary) hypertension: Secondary | ICD-10-CM | POA: Diagnosis not present

## 2017-02-20 DIAGNOSIS — Z0000 Encounter for general adult medical examination without abnormal findings: Secondary | ICD-10-CM | POA: Diagnosis not present

## 2017-02-24 DIAGNOSIS — Z86718 Personal history of other venous thrombosis and embolism: Secondary | ICD-10-CM | POA: Diagnosis not present

## 2017-02-24 DIAGNOSIS — R972 Elevated prostate specific antigen [PSA]: Secondary | ICD-10-CM | POA: Diagnosis not present

## 2017-02-24 DIAGNOSIS — I10 Essential (primary) hypertension: Secondary | ICD-10-CM | POA: Diagnosis not present

## 2017-04-13 ENCOUNTER — Emergency Department
Admission: EM | Admit: 2017-04-13 | Discharge: 2017-04-13 | Disposition: A | Discharge status: Home / Self Care | Payer: 59 | Attending: Emergency Medicine | Admitting: Emergency Medicine

## 2017-04-13 ENCOUNTER — Emergency Department: Payer: 59

## 2017-04-13 ENCOUNTER — Encounter: Payer: Self-pay | Admitting: *Deleted

## 2017-04-13 DIAGNOSIS — M79661 Pain in right lower leg: Secondary | ICD-10-CM | POA: Insufficient documentation

## 2017-04-13 DIAGNOSIS — M7989 Other specified soft tissue disorders: Secondary | ICD-10-CM | POA: Diagnosis not present

## 2017-04-13 DIAGNOSIS — Z79899 Other long term (current) drug therapy: Secondary | ICD-10-CM | POA: Insufficient documentation

## 2017-04-13 DIAGNOSIS — I10 Essential (primary) hypertension: Secondary | ICD-10-CM | POA: Diagnosis not present

## 2017-04-13 DIAGNOSIS — F17210 Nicotine dependence, cigarettes, uncomplicated: Secondary | ICD-10-CM | POA: Diagnosis not present

## 2017-04-13 DIAGNOSIS — M79604 Pain in right leg: Secondary | ICD-10-CM | POA: Diagnosis not present

## 2017-04-13 HISTORY — DX: Acute embolism and thrombosis of unspecified deep veins of unspecified lower extremity: I82.409

## 2017-04-13 NOTE — ED Notes (Addendum)
FIRST NURSE: pt c/o possible blood clot in leg. Hx of same.

## 2017-04-13 NOTE — ED Provider Notes (Signed)
[facility] Emergency Department Provider Note  ____________________________________________   First MD Initiated Contact with Patient 04/13/17 1636     (approximate)  I have reviewed the triage vital signs and the nursing notes.   HISTORY  Chief Complaint Leg Swelling    HPI Lonnie Hernandez is a 46 y.o. male with a history that includes peripheral vascular disease and a prior right lower leg DVT about a year ago who is no longer on anticoagulation who presents for evaluation of acute onset right calf pain and some mild swelling that began acutely when he awoke this morning.  He states that it feels similar to when he had the DVT before, although he states "I do not know if it really hurts or if I am just paranoid".  He states the symptoms are mild and feels like a tightness and aching but he thought he should get it checked out.  Nothing in particular makes it better nor worse.  He is able to ambulate without difficulty.  He has some areas of sunburn on his legs and arms and face because he just came back from the beach but has no redness specifically on the area that is tender on the right lower extremity.  He denies any other symptoms specifically including chest pain, shortness of breath, nausea, vomiting, abdominal pain, headache, unsteadiness of gait, and any other extremity injury.   Past Medical History:  Diagnosis Date  . DVT (deep venous thrombosis) (HCC)   . Hypertension   . Peripheral vascular disease [facility])     Patient Active Problem List   Diagnosis Date Noted  . 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 Essential hypertension 09/27/2016  . Swelling of right lower extremity 09/27/2016    Past Surgical History:  Procedure Laterality Date  . PERIPHERAL VASCULAR CATHETERIZATION Right 07/14/2016   Procedure: Thrombectomy;  Surgeon: Annice Needy, MD;  Location: [facility] INVASIVE CV LAB;  Service:  Cardiovascular;  Laterality: Right;  . PERIPHERAL VASCULAR CATHETERIZATION N/A 07/14/2016   Procedure: IVC Filter Insertion;  Surgeon: Annice Needy, MD;  Location: [facility] INVASIVE CV LAB;  Service: Cardiovascular;  Laterality: N/A;  . PERIPHERAL VASCULAR CATHETERIZATION N/A 10/03/2016   Procedure: IVC Filter Removal;  Surgeon: Annice Needy, MD;  Location: [facility] INVASIVE CV LAB;  Service: Cardiovascular;  Laterality: N/A;    Prior to Admission medications   Medication Sig Start Date End Date Taking? Authorizing Provider  losartan (COZAAR) 100 MG tablet Take 100 mg by mouth daily. 06/21/16   Historical Provider, MD  naproxen sodium (ANAPROX) 220 MG tablet Take 220 mg by mouth 2 (two) times daily as needed (pain).    Historical Provider, MD  oxyCODONE-acetaminophen (PERCOCET/ROXICET) 5-325 MG tablet Take 1 tablet by mouth every 6 (six) hours as needed for pain. 07/04/16   Historical Provider, MD  Rivaroxaban 15 & 20 MG TBPK Take as directed on package: Start with one 15mg  tablet by mouth twice a day with food. On Day 22, switch to one 20mg  tablet once a day with food. 07/03/16   Phineas Semen, MD    Allergies Patient has no known allergies.  History reviewed. No pertinent family history.  Social History Social History  Substance Use Topics  . Smoking status: Current Every Day Smoker    Packs/day: 0.50    Years: 15.00    Types: Cigarettes  . Smokeless tobacco: Former Neurosurgeon  . Alcohol use 7.2 oz/week  12 Cans of beer per week    Review of Systems Constitutional: No fever/chills Eyes: No visual changes. ENT: No sore throat. Cardiovascular: Denies chest pain. Respiratory: Denies shortness of breath. Gastrointestinal: No abdominal pain.  No nausea, no vomiting.  No diarrhea.  No constipation. Genitourinary: Negative for dysuria. Musculoskeletal: Some mild pain and swelling in his right calf Integumentary: Negative for rash. Neurological: Negative for headaches, focal weakness or  numbness.   ____________________________________________   PHYSICAL EXAM:  VITAL SIGNS: ED Triage Vitals  Enc Vitals Group     BP 04/13/17 1521 (!) 170/87     Pulse Rate 04/13/17 1521 100     Resp 04/13/17 1521 20     Temp 04/13/17 1521 98 F (36.7 C)     Temp Source 04/13/17 1521 Oral     SpO2 04/13/17 1521 96 %     Weight 04/13/17 1522 213 lb (96.6 kg)     Height 04/13/17 1522 5\' 10"  (1.778 m)     Head Circumference --      Peak Flow --      Pain Score 04/13/17 1526 3     Pain Loc --      Pain Edu? --      Excl. in GC? --     Constitutional: Alert and oriented. Well appearing and in no acute distress. Eyes: Conjunctivae are normal. PERRL. EOMI. Cardiovascular: Normal rate, regular rhythm. Good peripheral circulation.  Respiratory: Normal respiratory effort.  No retractions.  Gastrointestinal: Soft and nontender.  Musculoskeletal: The patient's right calf feels a little bit more tight than his left but it is not tender to palpation.  There are no palpable cords or abnormalities in the popliteal fossa or along the deep vein tracks.  There is scattered erythema due to sun exposure but no different than on the left lower extremity.  He is neurovascularly intact with normal capillary refill. Neurologic:  Normal speech and language. No gross focal neurologic deficits are appreciated.  Skin:  Skin is warm, dry and intact. No rash noted. Psychiatric: Mood and affect are normal. Speech and behavior are normal.  ____________________________________________   LABS (all labs ordered are listed, but only abnormal results are displayed)  Labs Reviewed - No data to display ____________________________________________  EKG  None - EKG not ordered by ED physician ____________________________________________  RADIOLOGY   [place] Venous Img Lower Unilateral Right  Result Date: 04/13/2017 CLINICAL DATA:  46 y/o  M; right calf swelling and tenderness. EXAM: RIGHT LOWER EXTREMITY VENOUS  DOPPLER ULTRASOUND TECHNIQUE: Gray-scale sonography with graded compression, as well as color Doppler and duplex ultrasound were performed to evaluate the lower extremity deep venous systems from the level of the common femoral vein and including the common femoral, femoral, profunda femoral, popliteal and calf veins including the posterior tibial, peroneal and gastrocnemius veins when visible. The superficial great saphenous vein was also interrogated. Spectral Doppler was utilized to evaluate flow at rest and with distal augmentation maneuvers in the common femoral, femoral and popliteal veins. COMPARISON:  07/03/2016 right lower extremity venous ultrasound. FINDINGS: Contralateral Common Femoral Vein: Respiratory phasicity is normal and symmetric with the symptomatic side. No evidence of thrombus. Normal compressibility. Common Femoral Vein: No evidence of thrombus. Normal compressibility, respiratory phasicity and response to augmentation. Saphenofemoral Junction: No evidence of thrombus. Normal compressibility and flow on color Doppler imaging. Profunda Femoral Vein: No evidence of thrombus. Normal compressibility and flow on color Doppler imaging. Femoral Vein: No evidence of thrombus. Normal compressibility, respiratory phasicity  and response to augmentation. Popliteal Vein: No evidence of thrombus. Normal compressibility, respiratory phasicity and response to augmentation. Calf Veins: No thrombus identified. Flow is seen within the veins on color Doppler. Suboptimal visualization due to soft tissue swelling. Superficial Great Saphenous Vein: No evidence of thrombus. Normal compressibility and flow on color Doppler imaging. Venous Reflux:  None. Other Findings:  None. IMPRESSION: No evidence of DVT within the right lower extremity. Suboptimal evaluation of the calf veins due to soft tissue swelling, blood flow is seen in calf veins on color Doppler. Electronically Signed   By: Mitzi Hansen M.D.    On: 04/13/2017 16:36    ____________________________________________   PROCEDURES  Critical Care performed: No   Procedure(s) performed:   Procedures   ____________________________________________   INITIAL IMPRESSION / ASSESSMENT AND PLAN / ED COURSE  Pertinent labs & imaging results that were available during my care of the patient were reviewed by me and considered in my medical decision making (see chart for details).  The patient's risk factors for DVT include prior DVT and the fact that he just cut back from the beach, which was the same thing that happened to him last year.  As documented below with the time stamp, his ultrasound report is negative for DVT per the radiologist.  The patient felt reassured by this and does not want any further workup or evaluation.  He will follow-up with his primary care doctor.  I gave him my usual and customary return precautions.   Clinical Course as of Apr 13 1721  Thu Apr 13, 2017  1645 No evidence of DVT per radiologist's report US Venous Img Lower Unilateral Right [CF]    Clinical Course User Index [CF] Loleta Rose, MD    ____________________________________________  FINAL CLINICAL IMPRESSION(S) / ED DIAGNOSES  Final diagnoses:  Pain in right lower leg     MEDICATIONS GIVEN DURING THIS VISIT:  Medications - No data to display   NEW OUTPATIENT MEDICATIONS STARTED DURING THIS VISIT:  New Prescriptions   No medications on file    Modified Medications   No medications on file    Discontinued Medications   No medications on file     Note:  This document was prepared using Dragon voice recognition software and may include unintentional dictation errors.    Loleta Rose, MD 04/13/17 [id number]

## 2017-04-13 NOTE — ED Triage Notes (Addendum)
Pt reports new onset of right calf swelling and tenderness this morning. Pt reports having had a DVT last year with similar symptoms. Leg is red at this time but pt has sunburn. Pt denies decreased sensation in foot or leg.

## 2017-04-13 NOTE — Discharge Instructions (Signed)
As we discussed, the radiologist saw no evidence of DVT on your ultrasound.  If you are not already taking a daily baby aspirin, we encourage you to do so (since you are no longer taking any blood thinners it should be safe for you to take baby aspirin).  Follow-up with your primary care doctor at the next available opportunity and use over-the-counter Tylenol and/or ibuprofen as needed for discomfort.  Return to the emergency department if you develop new or worsening symptoms that concern you.

## 2017-04-13 NOTE — ED Notes (Signed)
Pt denies having Chest pain at this time.

## 2017-04-13 NOTE — ED Notes (Signed)
Pt states right calf swelling and cramping that began today, states hx of DVT in his right leg in July of 2017, states he was on xarelto for 6 months after but is no longer on blood thinners, awake and alert in no acute distress

## 2017-10-06 DIAGNOSIS — L738 Other specified follicular disorders: Secondary | ICD-10-CM | POA: Diagnosis not present

## 2017-10-06 DIAGNOSIS — L570 Actinic keratosis: Secondary | ICD-10-CM | POA: Diagnosis not present

## 2017-12-15 IMAGING — US US EXTREM LOW VENOUS*R*
1 series · 14 of 24 positions shown · non-contrast
Comparison: None.

CLINICAL DATA: Right lower leg pain/ swelling x2 weeks



[Series 1: us extrem low venous*right* · 0.07mm/px · 14 of 43 slices shown]
[im 1/43]
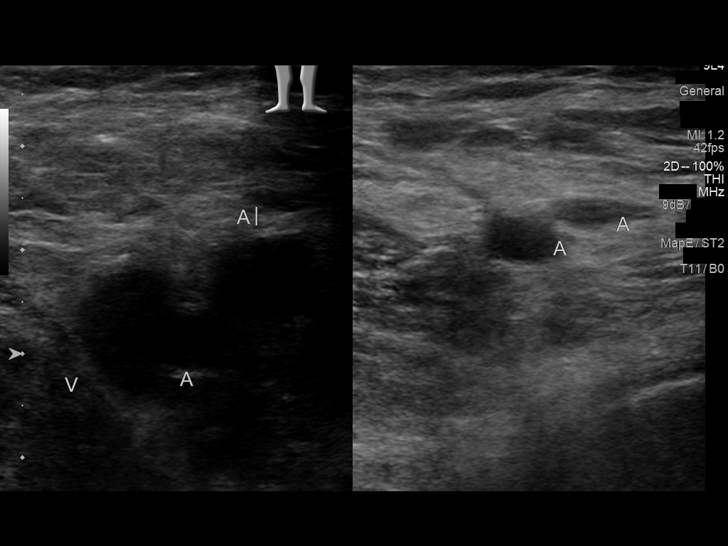
[im 4/43]
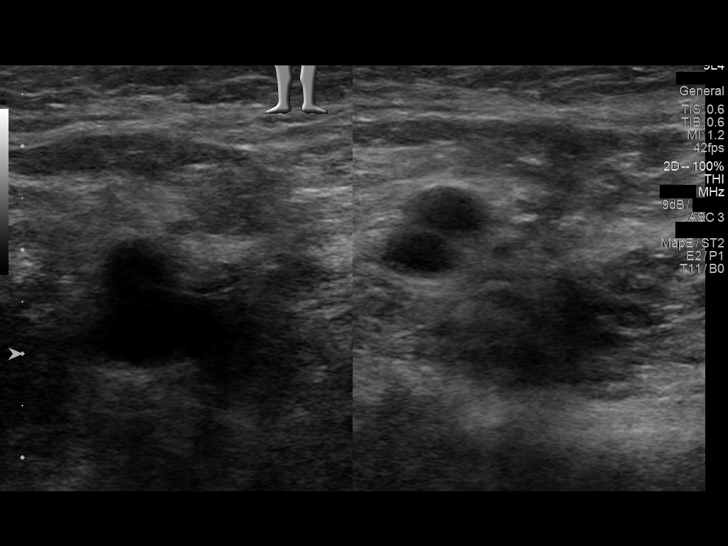
[im 8/43]
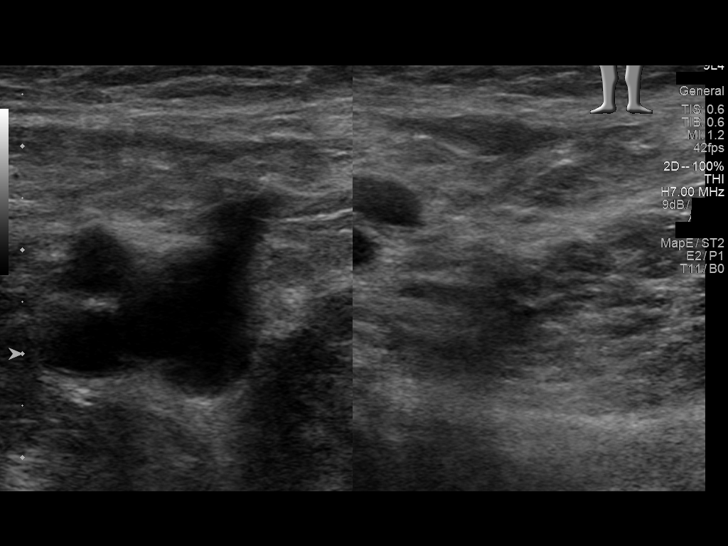
[im 11/43]
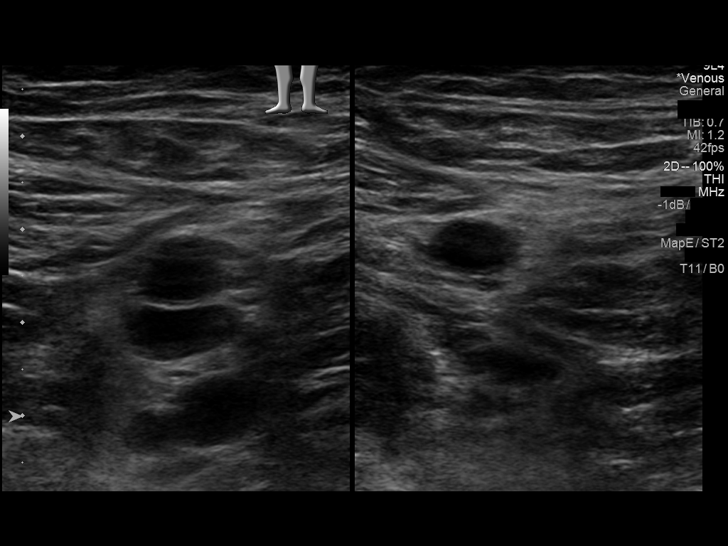
[im 13/43]
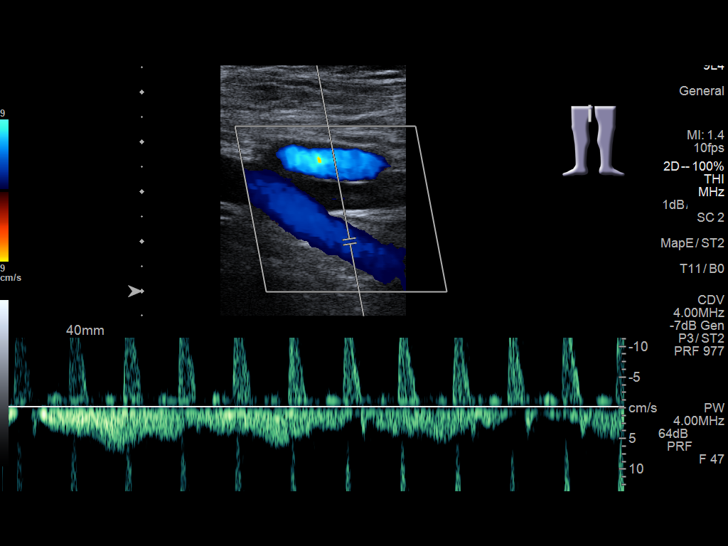
[im 17/43]
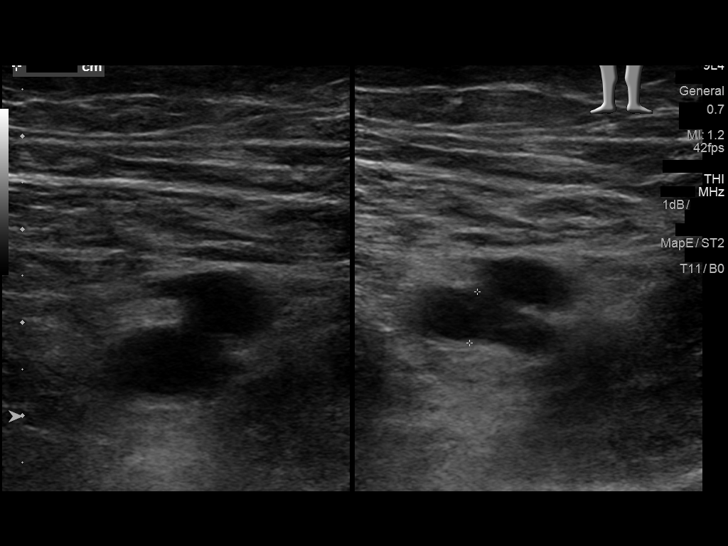
[im 21/43]
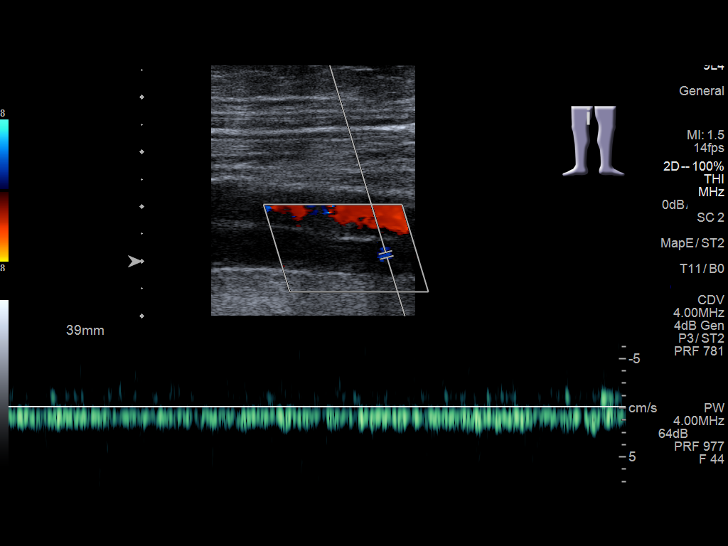
[im 22/43]
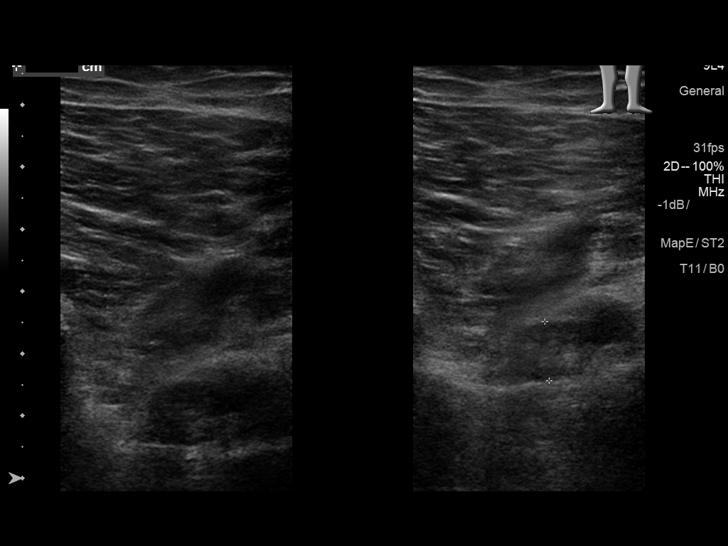
[im 26/43]
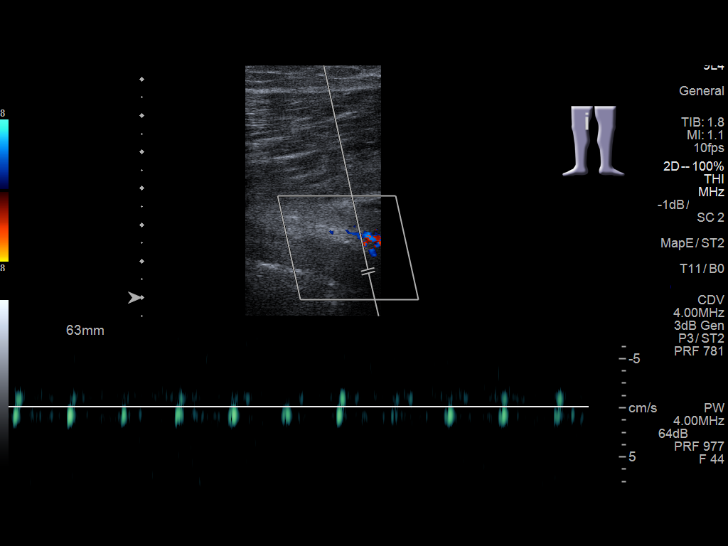
[im 30/43]
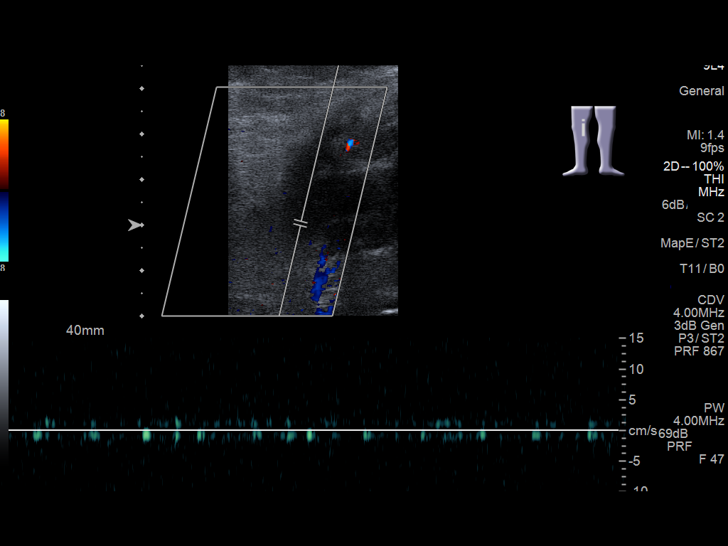
[im 33/43]
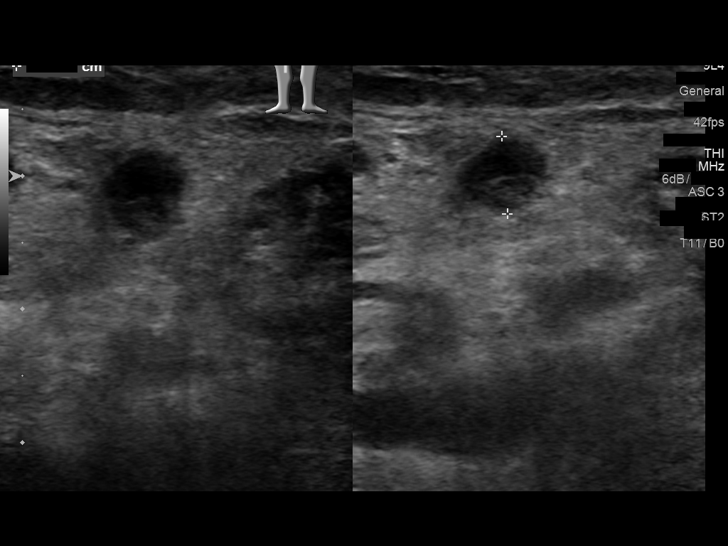
[im 35/43]
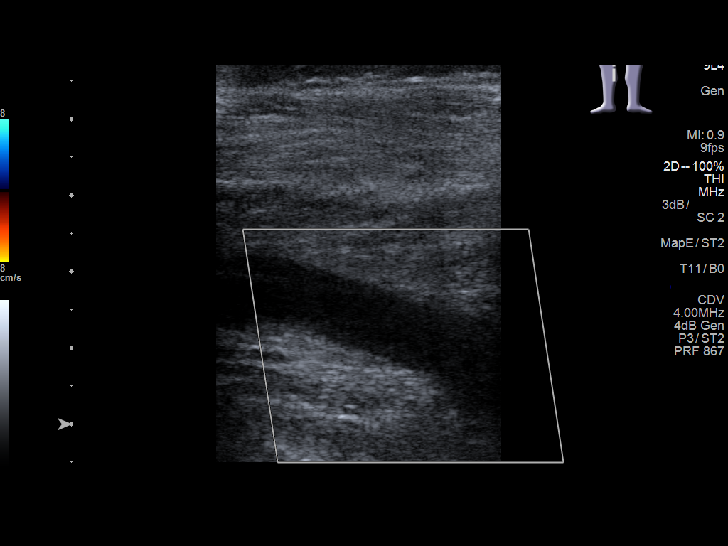
[im 39/43]
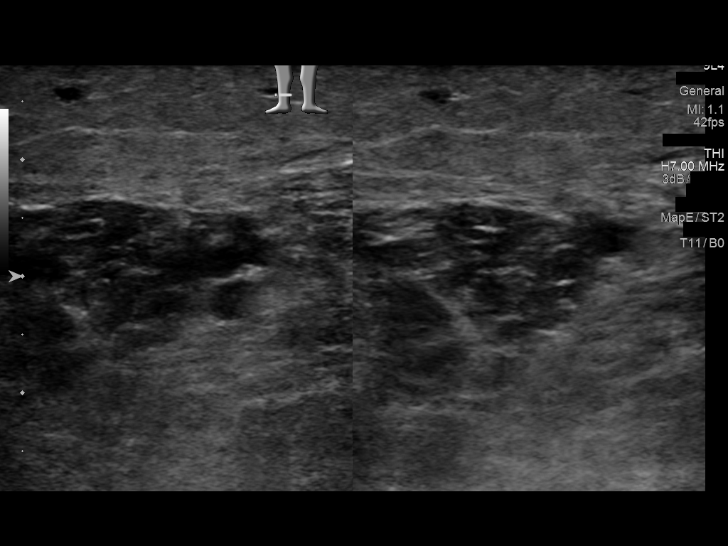
[im 43/43]
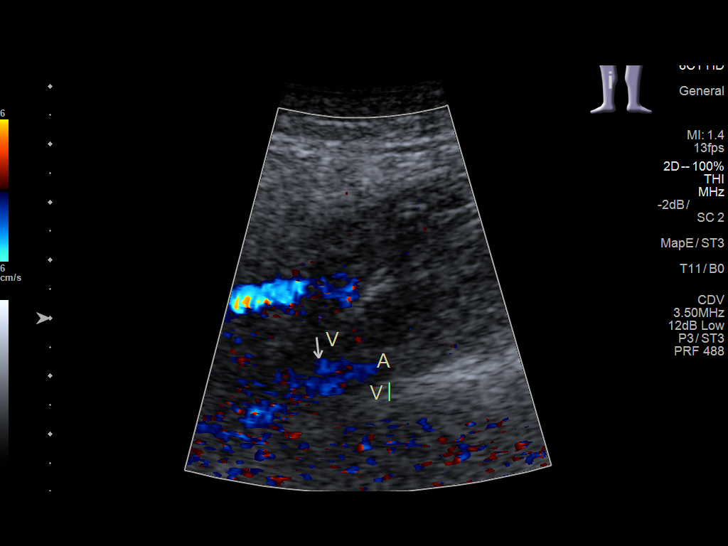

[14 of 24 positions shown; findings below may reference images not displayed]

FINDINGS: Occlusive thrombus in the distal femoral vein, popliteal vein, and
lesser saphenous vein.

Nonocclusive thrombus in the mid femoral vein.

Suspected thrombus in the right peroneal vein in the calf.
IMPRESSION: Deep venous thrombosis extending from the right peroneal vein to the
mid femoral vein, as above.

## 2018-07-13 DIAGNOSIS — I10 Essential (primary) hypertension: Secondary | ICD-10-CM | POA: Diagnosis not present

## 2018-07-13 DIAGNOSIS — Z125 Encounter for screening for malignant neoplasm of prostate: Secondary | ICD-10-CM | POA: Diagnosis not present

## 2018-07-13 DIAGNOSIS — Z131 Encounter for screening for diabetes mellitus: Secondary | ICD-10-CM | POA: Diagnosis not present

## 2018-07-16 DIAGNOSIS — Z0000 Encounter for general adult medical examination without abnormal findings: Secondary | ICD-10-CM | POA: Diagnosis not present

## 2018-07-16 DIAGNOSIS — E1165 Type 2 diabetes mellitus with hyperglycemia: Secondary | ICD-10-CM | POA: Diagnosis not present

## 2018-07-16 DIAGNOSIS — I10 Essential (primary) hypertension: Secondary | ICD-10-CM | POA: Diagnosis not present

## 2019-08-12 ENCOUNTER — Ambulatory Visit (INDEPENDENT_AMBULATORY_CARE_PROVIDER_SITE_OTHER): Payer: 59

## 2019-08-12 ENCOUNTER — Ambulatory Visit
Admission: EM | Admit: 2019-08-12 | Discharge: 2019-08-12 | Disposition: A | Discharge status: Home / Self Care | Payer: 59 | Attending: Family Medicine | Admitting: Family Medicine

## 2019-08-12 DIAGNOSIS — S51832A Puncture wound without foreign body of left forearm, initial encounter: Secondary | ICD-10-CM | POA: Diagnosis not present

## 2019-08-12 DIAGNOSIS — S51812A Laceration without foreign body of left forearm, initial encounter: Secondary | ICD-10-CM | POA: Diagnosis not present

## 2019-08-12 DIAGNOSIS — W540XXA Bitten by dog, initial encounter: Secondary | ICD-10-CM | POA: Diagnosis not present

## 2019-08-12 DIAGNOSIS — M79602 Pain in left arm: Secondary | ICD-10-CM | POA: Diagnosis not present

## 2019-08-12 MED ORDER — MUPIROCIN 2 % EX OINT: TOPICAL_OINTMENT | CUTANEOUS | 0 refills | Status: DC

## 2019-08-12 MED ORDER — AMOXICILLIN-POT CLAVULANATE 875-125 MG PO TABS: 1.0000 | ORAL_TABLET | Freq: Two times a day (BID) | ORAL | 0 refills | Status: DC

## 2019-08-12 NOTE — ED Triage Notes (Signed)
Pt states last night he was standing in his driveway and petted his neighbors dog as usual and when he was pulling back he bit his left arm. He is up to date on his shots. Was a Husky. States he has changed the gauze 3 times and it is still bleeding. Is up to date on his tdap.

## 2019-08-12 NOTE — Discharge Instructions (Signed)
Take medication as prescribed. Rest. Ice. Stretch. Keep clean. Monitor.   Follow up with your primary care physician this week as needed. Return to Urgent care for new or worsening concerns.

## 2019-08-12 NOTE — ED Provider Notes (Signed)
[facility] ____________________________________________  Time seen: Approximately 1:22 PM  I have reviewed the triage vital signs and the nursing notes.  HISTORY  Chief Complaint Animal Bite  HPI Lonnie Hernandez is a 48 y.o. male presenting for evaluation of left forearm laceration and puncture wounds after dog bite that occurred late last night.  Patient reports he was standing outside talking with his neighbors and petting his neighbor's dog.  States his neighbor dog then bit and release his left forearm causing injury. Reports approximately 50lb husky dog. Reports has had pain since injury around 9:30pm. States pain is mostly with movement of the arm or direct palpation.  Denies decreased range of motion or paresthesias.  No pain radiation.  Right-hand-dominant.  Last tetanus immunization was in 2017.  Reports dog is up-to-date on rabies immunizations.  Did clean last night.  Denies other aggravating alleviating factors.  Reports blood pressures been doing well at home and has been taking medication as prescribed. Denies chest pain, shortness of breath, cough, fever or recent sickness.  Barbette Reichmann, MD: PCP   Past Medical History:  Diagnosis Date  . DVT (deep venous thrombosis) (HCC)   . Hypertension   . Peripheral vascular disease [facility])     Patient Active Problem List   Diagnosis Date Noted  . 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 Essential hypertension 09/27/2016  . Swelling of right lower extremity 09/27/2016    Past Surgical History:  Procedure Laterality Date  . PERIPHERAL VASCULAR CATHETERIZATION Right 07/14/2016   Procedure: Thrombectomy;  Surgeon: Annice Needy, MD;  Location: [facility] INVASIVE CV LAB;  Service: Cardiovascular;  Laterality: Right;  . PERIPHERAL VASCULAR CATHETERIZATION N/A 07/14/2016   Procedure: IVC Filter Insertion;  Surgeon: Annice Needy, MD;  Location: [facility] INVASIVE CV LAB;  Service:  Cardiovascular;  Laterality: N/A;  . PERIPHERAL VASCULAR CATHETERIZATION N/A 10/03/2016   Procedure: IVC Filter Removal;  Surgeon: Annice Needy, MD;  Location: [facility] INVASIVE CV LAB;  Service: Cardiovascular;  Laterality: N/A;     No current facility-administered medications for this encounter.   Current Outpatient Medications:  .  aspirin 81 MG chewable tablet, Chew by mouth., Disp: , Rfl:  .  losartan (COZAAR) 100 MG tablet, Take 100 mg by mouth daily., Disp: , Rfl: 0 .  amoxicillin-clavulanate (AUGMENTIN) 875-125 MG tablet, Take 1 tablet by mouth every 12 (twelve) hours., Disp: 20 tablet, Rfl: 0 .  mupirocin ointment (BACTROBAN) 2 %, Apply two times a day for 5 days., Disp: 22 g, Rfl: 0 .  naproxen sodium (ANAPROX) 220 MG tablet, Take 220 mg by mouth 2 (two) times daily as needed (pain)., Disp: , Rfl:  .  oxyCODONE-acetaminophen (PERCOCET/ROXICET) 5-325 MG tablet, Take 1 tablet by mouth every 6 (six) hours as needed for pain., Disp: , Rfl: 0 .  Rivaroxaban 15 & 20 MG TBPK, Take as directed on package: Start with one 15mg  tablet by mouth twice a day with food. On Day 22, switch to one 20mg  tablet once a day with food., Disp: 51 each, Rfl: 0  Allergies Patient has no known allergies.  Family History  Problem Relation Age of Onset  . Hypertension Mother   . Liver disease Mother   . Hypertension Father     Social History Social History   Tobacco Use  . Smoking status: Current Every Day Smoker    Packs/day: 0.50    Years: 15.00    Pack years:  7.50    Types: Cigarettes  . Smokeless tobacco: Former Network engineer Use Topics  . Alcohol use: Yes    Alcohol/week: 12.0 standard drinks    Types: 12 Cans of beer per week  . Drug use: No    Review of Systems Constitutional: No fever Cardiovascular: Denies chest pain. Respiratory: Denies shortness of breath. Gastrointestinal: No abdominal pain.   Musculoskeletal: Positive left forearm pain. Skin: As above. Neurological: Negative  for headaches, focal weakness or numbness.   ____________________________________________   PHYSICAL EXAM:  VITAL SIGNS: ED Triage Vitals  Enc Vitals Group     BP 08/12/19 1250 (!) 174/100     Pulse Rate 08/12/19 1250 (!) 114     Resp 08/12/19 1250 18     Temp 08/12/19 1250 98.5 F (36.9 C)     Temp Source 08/12/19 1250 Oral     SpO2 08/12/19 1250 98 %     Weight --      Height --      Head Circumference --      Peak Flow --      Pain Score 08/12/19 1251 4     Pain Loc --      Pain Edu? --      Excl. in [place]? --     Constitutional: Alert and oriented. Well appearing and in no acute distress. Eyes: Conjunctivae are normal.  ENT      Head: Normocephalic and atraumatic. Cardiovascular: Normal rate, regular rhythm. Grossly normal heart sounds.  Good peripheral circulation. Respiratory: Normal respiratory effort without tachypnea nor retractions. Breath sounds are clear and equal bilaterally. No wheezes, rales, rhonchi. Gastrointestinal: Soft and nontender.  Musculoskeletal: Steady gait.  Bilateral distal radial pulses equal.  Left hand grip strong.  No motor or tendon deficit noted to left hand, normal distal sensation and capillary refill. Left forearm wounds as depicted below with tenderness around medial laceration, minimal bleeding, no purulence, no foreign body noted.    Left forearm dorsal aspect 1 cm jagged laceration with superficial abrasions and puncture mark with mild tenderness, volar aspect 0.5 cm distal wrist laceration without bleeding. Neurologic:  Normal speech and language. Skin:  Skin is warm, dry and intact. No rash noted. Psychiatric: Mood and affect are normal. Speech and behavior are normal. Patient exhibits appropriate insight and judgment   ___________________________________________   LABS (all labs ordered are listed, but only abnormal results are displayed)  Labs Reviewed - No data to display ____________________________________________   RADIOLOGY  Dg Forearm Left  Result Date: 08/12/2019 CLINICAL DATA:  Dog bite today mid post forearm. 6 puncture wounds. R/o FB EXAM: LEFT FOREARM - 2 VIEW COMPARISON:  None FINDINGS: No evidence of fracture or dislocation. There is some air in the lateral soft tissues which may correspond to the reported puncture wounds. No radiopaque foreign body identified. IMPRESSION: No radiopaque foreign body identified in the left arm. Electronically Signed   By: Audie Pinto M.D.   On: 08/12/2019 13:35   ____________________________________________   PROCEDURES Procedures   Wound cleaned with Betadine and saline by nursing staff.  Patient tolerated well.  INITIAL IMPRESSION / ASSESSMENT AND PLAN / ED COURSE  Pertinent labs & imaging results that were available during my care of the patient were reviewed by me and considered in my medical decision making (see chart for details).  Well-appearing patient.  No acute distress. Wound copiously cleaned by nursing staff.  Left forearm x-ray as above per radiologist, no radiopaque foreign body or  bony abnormality.  No repair indicated to wounds. Will treat patient with oral Augmentin and bactroban, patient declined need for pain medication.  Discussed keeping clean monitoring.  Cock-up splint given for support.  Stretching.  Discussed very close monitoring. Monitor blood pressure at home. Animal control notified by nursing staff.Discussed indication, risks and benefits of medications with patient.  Discussed follow up with Primary care physician this week as needed. Discussed follow up and return parameters including no resolution or any worsening concerns. Patient verbalized understanding and agreed to plan.   ____________________________________________   FINAL CLINICAL IMPRESSION(S) / ED DIAGNOSES  Final diagnoses:  Dog bite, initial encounter  Forearm laceration, left, initial encounter  Puncture wound of left forearm, initial encounter      ED Discharge Orders         Ordered    amoxicillin-clavulanate (AUGMENTIN) 875-125 MG tablet  Every 12 hours     08/12/19 1343    mupirocin ointment (BACTROBAN) 2 %     08/12/19 1454           Note: This dictation was prepared with Dragon dictation along with smaller phrase technology. Any transcriptional errors that result from this process are unintentional.         Renford DillsMiller, Brandell Maready, NP 08/12/19 1539

## 2020-10-28 ENCOUNTER — Other Ambulatory Visit: Payer: Self-pay

## 2020-10-28 ENCOUNTER — Emergency Department
Admission: EM | Admit: 2020-10-28 | Discharge: 2020-10-28 | Disposition: A | Discharge status: Home / Self Care | Payer: No Typology Code available for payment source | Attending: Emergency Medicine | Admitting: Emergency Medicine

## 2020-10-28 ENCOUNTER — Emergency Department: Payer: No Typology Code available for payment source

## 2020-10-28 DIAGNOSIS — Z7901 Long term (current) use of anticoagulants: Secondary | ICD-10-CM | POA: Insufficient documentation

## 2020-10-28 DIAGNOSIS — R0781 Pleurodynia: Secondary | ICD-10-CM | POA: Insufficient documentation

## 2020-10-28 DIAGNOSIS — D696 Thrombocytopenia, unspecified: Secondary | ICD-10-CM

## 2020-10-28 DIAGNOSIS — Z79899 Other long term (current) drug therapy: Secondary | ICD-10-CM | POA: Insufficient documentation

## 2020-10-28 DIAGNOSIS — I10 Essential (primary) hypertension: Secondary | ICD-10-CM | POA: Diagnosis not present

## 2020-10-28 DIAGNOSIS — R109 Unspecified abdominal pain: Secondary | ICD-10-CM

## 2020-10-28 DIAGNOSIS — E1159 Type 2 diabetes mellitus with other circulatory complications: Secondary | ICD-10-CM | POA: Diagnosis not present

## 2020-10-28 DIAGNOSIS — W19XXXA Unspecified fall, initial encounter: Secondary | ICD-10-CM

## 2020-10-28 DIAGNOSIS — N200 Calculus of kidney: Secondary | ICD-10-CM | POA: Diagnosis not present

## 2020-10-28 DIAGNOSIS — F17210 Nicotine dependence, cigarettes, uncomplicated: Secondary | ICD-10-CM | POA: Insufficient documentation

## 2020-10-28 DIAGNOSIS — Z7982 Long term (current) use of aspirin: Secondary | ICD-10-CM | POA: Diagnosis not present

## 2020-10-28 DIAGNOSIS — W182XXA Fall in (into) shower or empty bathtub, initial encounter: Secondary | ICD-10-CM | POA: Insufficient documentation

## 2020-10-28 HISTORY — DX: Type 2 diabetes mellitus without complications: E11.9

## 2020-10-28 LAB — COMPREHENSIVE METABOLIC PANEL
ALT: 45 U/L — ABNORMAL HIGH (ref 0–44)
AST: 45 U/L — ABNORMAL HIGH (ref 15–41)
Albumin: 3.9 g/dL (ref 3.5–5.0)
Alkaline Phosphatase: 86 U/L (ref 38–126)
Anion gap: 10 (ref 5–15)
BUN: 12 mg/dL (ref 6–20)
CO2: 25 mmol/L (ref 22–32)
Calcium: 9.5 mg/dL (ref 8.9–10.3)
Chloride: 98 mmol/L (ref 98–111)
Creatinine, Ser: 0.56 mg/dL — ABNORMAL LOW (ref 0.61–1.24)
GFR, Estimated: >60 mL/min (ref >60)
Glucose, Bld: 189 mg/dL — ABNORMAL HIGH (ref 70–99)
Potassium: 3.5 mmol/L (ref 3.5–5.1)
Sodium: 133 mmol/L — ABNORMAL LOW (ref 135–145)
Total Bilirubin: 1.1 mg/dL (ref 0.3–1.2)
Total Protein: 8 g/dL (ref 6.5–8.1)

## 2020-10-28 LAB — URINALYSIS, COMPLETE (UACMP) WITH MICROSCOPIC
Bacteria, UA: NONE SEEN
Bilirubin Urine: NEGATIVE
Glucose, UA: >=500 mg/dL — AB
Ketones, ur: 5 mg/dL — AB
Leukocytes,Ua: NEGATIVE
Nitrite: NEGATIVE
Protein, ur: 30 mg/dL — AB
Specific Gravity, Urine: 1.023 (ref 1.005–1.030)
pH: 6 (ref 5.0–8.0)

## 2020-10-28 LAB — CBC
HCT: 43.3 % (ref 39.0–52.0)
Hemoglobin: 15.5 g/dL (ref 13.0–17.0)
MCH: 35.5 pg — ABNORMAL HIGH (ref 26.0–34.0)
MCHC: 35.8 g/dL (ref 30.0–36.0)
MCV: 99.1 fL (ref 80.0–100.0)
Platelets: 97 10*3/uL — ABNORMAL LOW (ref 150–400)
RBC: 4.37 MIL/uL (ref 4.22–5.81)
RDW: 12.1 % (ref 11.5–15.5)
WBC: 6.6 10*3/uL (ref 4.0–10.5)
nRBC: 0.3 % — ABNORMAL HIGH (ref 0.0–0.2)

## 2020-10-28 LAB — LIPASE, BLOOD: Lipase: 46 U/L (ref 11–51)

## 2020-10-28 MED ORDER — IOHEXOL 300 MG/ML  SOLN
100.0000 mL | Freq: Once | INTRAMUSCULAR | Status: AC | PRN
  Administered 2020-10-28: 100 mL via INTRAVENOUS
  Filled 2020-10-28: qty 100

## 2020-10-28 MED ORDER — ACETAMINOPHEN 500 MG PO TABS: 500.0000 mg | ORAL_TABLET | Freq: Four times a day (QID) | ORAL | 0 refills | Status: DC | PRN

## 2020-10-28 NOTE — ED Notes (Signed)
Patient c/o left, lower rib cage pain. Patient denies pain while sitting or taking a deep breath, but c/o increased pain with ambulation and coughing.

## 2020-10-28 NOTE — ED Notes (Signed)
Patient declined discharged vital signs. 

## 2020-10-28 NOTE — ED Triage Notes (Signed)
Reports fall X 2 days ago to same side and awoke today with left sided rib pain, tender to touch. Denies CP, SOB or abdominal pain. Pt alert and oriented X4, cooperative, RR even and unlabored, color WNL. Pt in NAD.

## 2020-10-28 NOTE — ED Provider Notes (Signed)
[facility] Emergency Department Provider Note  ____________________________________________  Time seen: Approximately 3:24 PM  I have reviewed the triage vital signs and the nursing notes.   HISTORY  Chief Complaint Rib Pain    HPI Lonnie Hernandez is a 49 y.o. male that presents to the emergency department for evaluation of left flank pain last night. Patient states that he did fall in the shower 2 days ago. He did not have any pain following the fall. He developed a sharp pain last night to his left flank.  Pain is mild currently.  He does have a history of a kidney stone about 10 years ago. No fever, shortness of breath, chest pain, nausea, vomiting, urinary symptoms, hematuria.   Past Medical History:  Diagnosis Date  . Diabetes mellitus without complication (HCC)   . DVT (deep venous thrombosis) (HCC)   . Hypertension   . Peripheral vascular disease [facility])     Patient Active Problem List   Diagnosis Date Noted  . 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 Essential hypertension 09/27/2016  . Swelling of right lower extremity 09/27/2016    Past Surgical History:  Procedure Laterality Date  . PERIPHERAL VASCULAR CATHETERIZATION Right 07/14/2016   Procedure: Thrombectomy;  Surgeon: Annice Needy, MD;  Location: [facility] INVASIVE CV LAB;  Service: Cardiovascular;  Laterality: Right;  . PERIPHERAL VASCULAR CATHETERIZATION N/A 07/14/2016   Procedure: IVC Filter Insertion;  Surgeon: Annice Needy, MD;  Location: [facility] INVASIVE CV LAB;  Service: Cardiovascular;  Laterality: N/A;  . PERIPHERAL VASCULAR CATHETERIZATION N/A 10/03/2016   Procedure: IVC Filter Removal;  Surgeon: Annice Needy, MD;  Location: [facility] INVASIVE CV LAB;  Service: Cardiovascular;  Laterality: N/A;    Prior to Admission medications   Medication Sig Start Date End Date Taking? Authorizing Provider  acetaminophen (TYLENOL) 500 MG tablet Take 1  tablet (500 mg total) by mouth every 6 (six) hours as needed. 10/28/20   Enid Derry, PA-C  amoxicillin-clavulanate (AUGMENTIN) 875-125 MG tablet Take 1 tablet by mouth every 12 (twelve) hours. 08/12/19   Renford Dills, NP  aspirin 81 MG chewable tablet Chew by mouth.    [provider]  losartan (COZAAR) 100 MG tablet Take 100 mg by mouth daily. 06/21/16   [provider]  mupirocin ointment (BACTROBAN) 2 % Apply two times a day for 5 days. 08/12/19   Renford Dills, NP  naproxen sodium (ANAPROX) 220 MG tablet Take 220 mg by mouth 2 (two) times daily as needed (pain).    [provider]  oxyCODONE-acetaminophen (PERCOCET/ROXICET) 5-325 MG tablet Take 1 tablet by mouth every 6 (six) hours as needed for pain. 07/04/16   [provider]  Rivaroxaban 15 & 20 MG TBPK Take as directed on package: Start with one 15mg  tablet by mouth twice a day with food. On Day 22, switch to one 20mg  tablet once a day with food. 07/03/16   , MD    Allergies Patient has no known allergies.  Family History  Problem Relation Age of Onset  . Hypertension Mother   . Liver disease Mother   . Hypertension Father     Social History Social History   Tobacco Use  . Smoking status: Current Every Day Smoker    Packs/day: 0.50    Years: 15.00    Pack years: 7.50    Types: Cigarettes  . Smokeless tobacco: Former 07/05/16  . Vaping  Use: Never used  Substance Use Topics  . Alcohol use: Yes    Alcohol/week: 12.0 standard drinks    Types: 12 Cans of beer per week  . Drug use: No     Review of Systems  Constitutional: No fever/chills ENT: No upper respiratory complaints. Cardiovascular: No chest pain. Respiratory: No cough. No SOB. Gastrointestinal: Positive for flank pain.  No nausea, no vomiting.  Genitourinary: Negative for dysuria. Musculoskeletal: Negative for musculoskeletal pain. Skin: Negative for rash, abrasions, lacerations,  ecchymosis. Neurological: Negative for headaches   ____________________________________________   PHYSICAL EXAM:  VITAL SIGNS: ED Triage Vitals  Enc Vitals Group     BP 10/28/20 1311 (!) 166/90     Pulse Rate 10/28/20 1311 87     Resp 10/28/20 1311 18     Temp 10/28/20 1311 98.8 F (37.1 C)     Temp Source 10/28/20 1311 Oral     SpO2 10/28/20 1311 97 %     Weight 10/28/20 1312 195 lb (88.5 kg)     Height 10/28/20 1312 5\' 9"  (1.753 m)     Head Circumference --      Peak Flow --      Pain Score 10/28/20 1312 0     Pain Loc --      Pain Edu? --      Excl. in [place]? --      Constitutional: Alert and oriented. Well appearing and in no acute distress.  Sitting comfortably. Eyes: Conjunctivae are normal. PERRL. EOMI. Head: Atraumatic. ENT:      Ears:      Nose: No congestion/rhinnorhea.      Mouth/Throat: Mucous membranes are moist.  Neck: No stridor.   Cardiovascular: Normal rate, regular rhythm.  Good peripheral circulation. Respiratory: Normal respiratory effort without tachypnea or retractions. Lungs CTAB. Good air entry to the bases with no decreased or absent breath sounds. Gastrointestinal: Bowel sounds 4 quadrants. Soft. Left flank tenderness. No guarding or rigidity. No palpable masses. No distention. Musculoskeletal: Full range of motion to all extremities. No gross deformities appreciated.  No tenderness to palpation to left rib cage. No ecchymosis. Neurologic:  Normal speech and language. No gross focal neurologic deficits are appreciated.  Skin:  Skin is warm, dry and intact. No rash noted. Psychiatric: Mood and affect are normal. Speech and behavior are normal. Patient exhibits appropriate insight and judgement.   ____________________________________________   LABS (all labs ordered are listed, but only abnormal results are displayed)  Labs Reviewed  URINALYSIS, COMPLETE (UACMP) WITH MICROSCOPIC - Abnormal; Notable for the following components:      Result  Value   Color, Urine YELLOW (*)    APPearance CLEAR (*)    Glucose, UA >=500 (*)    Hgb urine dipstick SMALL (*)    Ketones, ur 5 (*)    Protein, ur 30 (*)    All other components within normal limits  CBC - Abnormal; Notable for the following components:   MCH 35.5 (*)    Platelets 97 (*)    nRBC 0.3 (*)    All other components within normal limits  COMPREHENSIVE METABOLIC PANEL - Abnormal; Notable for the following components:   Sodium 133 (*)    Glucose, Bld 189 (*)    Creatinine, Ser 0.56 (*)    AST 45 (*)    ALT 45 (*)    All other components within normal limits  URINE CULTURE  LIPASE, BLOOD   ____________________________________________  EKG   ____________________________________________  RADIOLOGY  I, Enid Derry, personally viewed and evaluated these images (plain radiographs) as part of my medical decision making, as well as reviewing the written report by the radiologist.  CT ABDOMEN PELVIS W CONTRAST  Result Date: 10/28/2020 CLINICAL DATA:  Left upper quadrant abdominal pain EXAM: CT ABDOMEN AND PELVIS WITH CONTRAST TECHNIQUE: Multidetector CT imaging of the abdomen and pelvis was performed using the standard protocol following bolus administration of intravenous contrast. CONTRAST:  OMNIPAQUE IOHEXOL 300 MG/ML  SOLN COMPARISON:  None. FINDINGS: Lower chest: The visualized heart size within normal limits. No pericardial fluid/thickening. No hiatal hernia. The visualized portions of the lungs are clear. Hepatobiliary: There is diffuse low density seen throughout the liver parenchyma. No focal hepatic lesion is seen. No intra or extrahepatic biliary ductal dilatation. The main portal vein is patent. No evidence of calcified gallstones, gallbladder wall thickening or biliary dilatation. Pancreas: Unremarkable. No pancreatic ductal dilatation or surrounding inflammatory changes. Spleen: Normal in size without focal abnormality. Adrenals/Urinary Tract: Both adrenal  glands appear normal. There is a punctate 3 mm calculus seen in lower the left kidney. A 1 cm low-density lesion seen in pole the right kidney. The bladder is partially decompressed with superior bladder wall. Stomach/Bowel: The stomach, small bowel, and colon are normal in appearance. No inflammatory changes, wall thickening, or obstructive findings.The appendix is normal. Vascular/Lymphatic: There are no enlarged mesenteric, retroperitoneal, or pelvic lymph nodes. Scattered aortic atherosclerotic calcifications are seen without aneurysmal dilatation. Reproductive: The prostate is unremarkable. Other: No evidence of abdominal wall mass or hernia. Musculoskeletal: No acute or significant osseous findings. IMPRESSION: No acute intra-abdominal or pelvic pathology to explain the patient's symptoms. Nonobstructing left renal calculus. Hepatic steatosis Aortic Atherosclerosis (ICD10-I70.0). Electronically Signed   By: Jonna Clark M.D.   On: 10/28/2020 16:29    ____________________________________________    PROCEDURES  Procedure(s) performed:    Procedures    Medications  iohexol (OMNIPAQUE) 300 MG/ML solution 100 mL (100 mLs Intravenous Contrast Given 10/28/20 1524)     ____________________________________________   INITIAL IMPRESSION / ASSESSMENT AND PLAN / ED COURSE  Pertinent labs & imaging results that were available during my care of the patient were reviewed by me and considered in my medical decision making (see chart for details).  Review of the [place] CSRS was performed in accordance of the [facility] prior to dispensing any controlled drugs.     Patient presented the emergency department for evaluation of flank pain.  Vital signs and exam are reassuring.  CBC remarkable for platelets 97.  CMP remarkable for sodium 133, glucose 189, creatinine 0.56, AST 45 and ALT 45.  Blood work was discussed with the patient and patient will follow up with primary care for a recheck of his blood work  next week.  Chest x-ray negative for rib fracture or acute cardiopulmonary processes.  CT scan does show a nonobstructive left renal stone.  I suspect this is what is causing patient's symptoms.  No indication of infection on urinalysis.  Patient will be discharged home with prescriptions for Tylenol. Patient is to follow up with primary care and urology as directed. Patient is given ED precautions to return to the ED for any worsening or new symptoms.   Lonnie Hernandez was evaluated in Emergency Department on 10/29/2020 for the symptoms described in the history of present illness. He was evaluated in the context of the global COVID-19 pandemic, which necessitated consideration that the patient might be at risk for infection with the SARS-CoV-2 virus that  causes COVID-19. Institutional protocols and algorithms that pertain to the evaluation of patients at risk for COVID-19 are in a state of rapid change based on information released by regulatory bodies including the CDC and federal and state organizations. These policies and algorithms were followed during the patient's care in the ED.  ____________________________________________  FINAL CLINICAL IMPRESSION(S) / ED DIAGNOSES  Final diagnoses:  Thrombocytopenia (HCC)  Nephrolithiasis  Fall, initial encounter  Flank pain      NEW MEDICATIONS STARTED DURING THIS VISIT:  ED Discharge Orders         Ordered    acetaminophen (TYLENOL) 500 MG tablet  Every 6 hours PRN        10/28/20 1640              This chart was dictated using voice recognition software/Dragon. Despite best efforts to proofread, errors can occur which can change the meaning. Any change was purely unintentional.    Enid Derry, PA-C 10/29/20 1529    Merwyn Katos, MD 11/04/20 [id number]

## 2020-10-29 LAB — URINE CULTURE: Culture: NO GROWTH

## 2020-12-22 ENCOUNTER — Other Ambulatory Visit: Payer: Self-pay

## 2020-12-22 ENCOUNTER — Other Ambulatory Visit
Admission: RE | Admit: 2020-12-22 | Discharge: 2020-12-22 | Disposition: A | Discharge status: Home / Self Care | Payer: No Typology Code available for payment source | Source: Ambulatory Visit | Attending: Surgery | Admitting: Surgery

## 2020-12-22 DIAGNOSIS — Z01812 Encounter for preprocedural laboratory examination: Secondary | ICD-10-CM | POA: Insufficient documentation

## 2020-12-22 DIAGNOSIS — Z20822 Contact with and (suspected) exposure to covid-19: Secondary | ICD-10-CM | POA: Diagnosis not present

## 2020-12-23 ENCOUNTER — Ambulatory Visit: Payer: Self-pay | Admitting: Surgery

## 2020-12-23 LAB — SARS CORONAVIRUS 2 (TAT 6-24 HRS): SARS Coronavirus 2: NEGATIVE

## 2020-12-23 NOTE — H&P (View-Only) (Signed)
Subjective:   CC: Non-recurrent unilateral inguinal hernia without obstruction or gangrene [K40.90]  HPI:  Lonnie Hernandez is a 50 y.o. male who was referred by Dayton Bailiff, PA for evaluation of above. Symptoms were first noted 1 week ago. Pain is sharp, radiating from the right groin, to the right testicle.  Associated with nothing specific, exacerbated by nothing specific.  Lump is reducible.    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  Past Surgical History:  Past Surgical History:  Procedure Laterality Date  . blood clot filter placed and removed  2017  . hemmorhoid removal  2017    Family History: family history includes Diabetes type II in his mother; High blood pressure (Hypertension) in his father and mother.  Social History:  reports that he has been smoking cigarettes. He has been smoking about 0.50 packs per day. He has never used smokeless tobacco. He reports current alcohol use. No history on file for drug use.  Current Medications: has a current medication list which includes the following prescription(s): amlodipine, aspirin, buspirone, carvedilol, citalopram, losartan, metformin, and sildenafil.  Allergies:  Allergies as of 12/22/2020  . (No Known Allergies)    ROS:  A 15 point review of systems was performed and pertinent positives and negatives noted in HPI   Objective:     BP 166/89   Pulse 90   Ht 175.3 cm (5\' 9" )   Wt 91.2 kg (201 lb)   BMI 29.68 kg/m   Constitutional :  alert, appears stated age, cooperative and no distress  Lymphatics/Throat:  no asymmetry, masses, or scars  Respiratory:  clear to auscultation bilaterally  Cardiovascular:  regular rate and rhythm  Gastrointestinal: soft, non-tender; bowel sounds normal; no masses,  no organomegaly. inguinal hernia noted.  small, no overlying skin changes and TTP  Musculoskeletal: Steady gait and movement   Skin: Cool and moist  Psychiatric: Normal affect, non-agitated, not confused       LABS:  n/a   RADS: n/a Assessment:       Non-recurrent unilateral inguinal hernia without obstruction or gangrene [K40.90], LEFT Hx of DVT Aspirin use DM  Plan:     1. Non-recurrent unilateral inguinal hernia without obstruction or gangrene [K40.90]   Discussed the risk of surgery including recurrence, which can be up to 50% in the case of incisional or complex hernias, possible use of prosthetic materials (mesh) and the increased risk of mesh infxn if used, bleeding, chronic pain, post-op infxn, post-op SBO or ileus, and possible re-operation to address said risks. The risks of general anesthetic, if used, includes MI, CVA, sudden death or even reaction to anesthetic medications also discussed. Alternatives include continued observation.  Benefits include possible symptom relief, prevention of incarceration, strangulation, enlargement in size over time, and the risk of emergency surgery in the face of strangulation.   Typical post-op recovery time of 3-5 days with 2 weeks of activity restrictions were also discussed.  ED return precautions given for sudden increase in pain, size of hernia with accompanying fever, nausea, and/or vomiting.  The patient verbalized understanding and all questions were answered to the patient's satisfaction.   2. Patient has elected to proceed with surgical treatment. Procedure will be scheduled.  Written consent was obtained. TTP moderate amount but has been over one week with interval times of improvement, so no emergent need for repair, but will proceed in next couple days. No  signs of bowel involvement at this time. robotic assisted laparoscopic.  Higher risk of bleeding issues secondary to aspirin use. Increased perioperative complications due to hx of DVT, DM, but benefits outweigh risks at this point.

## 2020-12-23 NOTE — H&P (Signed)
Subjective:   CC: Non-recurrent unilateral inguinal hernia without obstruction or gangrene [K40.90]  HPI:  Lonnie Hernandez is a 49 y.o. male who was referred by Robert John Tumey, PA for evaluation of above. Symptoms were first noted 1 week ago. Pain is sharp, radiating from the right groin, to the right testicle.  Associated with nothing specific, exacerbated by nothing specific.  Lump is reducible.    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  Past Surgical History:  Past Surgical History:  Procedure Laterality Date  . blood clot filter placed and removed  2017  . hemmorhoid removal  2017    Family History: family history includes Diabetes type II in his mother; High blood pressure (Hypertension) in his father and mother.  Social History:  reports that he has been smoking cigarettes. He has been smoking about 0.50 packs per day. He has never used smokeless tobacco. He reports current alcohol use. No history on file for drug use.  Current Medications: has a current medication list which includes the following prescription(s): amlodipine, aspirin, buspirone, carvedilol, citalopram, losartan, metformin, and sildenafil.  Allergies:  Allergies as of 12/22/2020  . (No Known Allergies)    ROS:  A 15 point review of systems was performed and pertinent positives and negatives noted in HPI   Objective:     BP 166/89   Pulse 90   Ht 175.3 cm (5' 9")   Wt 91.2 kg (201 lb)   BMI 29.68 kg/m   Constitutional :  alert, appears stated age, cooperative and no distress  Lymphatics/Throat:  no asymmetry, masses, or scars  Respiratory:  clear to auscultation bilaterally  Cardiovascular:  regular rate and rhythm  Gastrointestinal: soft, non-tender; bowel sounds normal; no masses,  no organomegaly. inguinal hernia noted.  small, no overlying skin changes and TTP  Musculoskeletal: Steady gait and movement   Skin: Cool and moist  Psychiatric: Normal affect, non-agitated, not confused       LABS:  n/a   RADS: n/a Assessment:       Non-recurrent unilateral inguinal hernia without obstruction or gangrene [K40.90], LEFT Hx of DVT Aspirin use DM  Plan:     1. Non-recurrent unilateral inguinal hernia without obstruction or gangrene [K40.90]   Discussed the risk of surgery including recurrence, which can be up to 50% in the case of incisional or complex hernias, possible use of prosthetic materials (mesh) and the increased risk of mesh infxn if used, bleeding, chronic pain, post-op infxn, post-op SBO or ileus, and possible re-operation to address said risks. The risks of general anesthetic, if used, includes MI, CVA, sudden death or even reaction to anesthetic medications also discussed. Alternatives include continued observation.  Benefits include possible symptom relief, prevention of incarceration, strangulation, enlargement in size over time, and the risk of emergency surgery in the face of strangulation.   Typical post-op recovery time of 3-5 days with 2 weeks of activity restrictions were also discussed.  ED return precautions given for sudden increase in pain, size of hernia with accompanying fever, nausea, and/or vomiting.  The patient verbalized understanding and all questions were answered to the patient's satisfaction.   2. Patient has elected to proceed with surgical treatment. Procedure will be scheduled.  Written consent was obtained. TTP moderate amount but has been over one week with interval times of improvement, so no emergent need for repair, but will proceed in next couple days. No   signs of bowel involvement at this time. robotic assisted laparoscopic.  Higher risk of bleeding issues secondary to aspirin use. Increased perioperative complications due to hx of DVT, DM, but benefits outweigh risks at this point.

## 2020-12-24 ENCOUNTER — Ambulatory Visit: Payer: No Typology Code available for payment source | Admitting: Anesthesiology

## 2020-12-24 ENCOUNTER — Encounter
Admission: RE | Disposition: A | Discharge status: Home / Self Care | Payer: Self-pay | Source: Home / Self Care | Attending: Surgery

## 2020-12-24 ENCOUNTER — Encounter: Payer: Self-pay | Admitting: Surgery

## 2020-12-24 ENCOUNTER — Ambulatory Visit
Admission: RE | Admit: 2020-12-24 | Discharge: 2020-12-24 | Disposition: A | Discharge status: Home / Self Care | Payer: No Typology Code available for payment source | Attending: Surgery | Admitting: Surgery

## 2020-12-24 ENCOUNTER — Other Ambulatory Visit: Payer: Self-pay

## 2020-12-24 DIAGNOSIS — E119 Type 2 diabetes mellitus without complications: Secondary | ICD-10-CM | POA: Insufficient documentation

## 2020-12-24 DIAGNOSIS — Z7982 Long term (current) use of aspirin: Secondary | ICD-10-CM | POA: Insufficient documentation

## 2020-12-24 DIAGNOSIS — Z86718 Personal history of other venous thrombosis and embolism: Secondary | ICD-10-CM | POA: Diagnosis not present

## 2020-12-24 DIAGNOSIS — K4030 Unilateral inguinal hernia, with obstruction, without gangrene, not specified as recurrent: Secondary | ICD-10-CM | POA: Insufficient documentation

## 2020-12-24 DIAGNOSIS — Z79899 Other long term (current) drug therapy: Secondary | ICD-10-CM | POA: Diagnosis not present

## 2020-12-24 DIAGNOSIS — K4090 Unilateral inguinal hernia, without obstruction or gangrene, not specified as recurrent: Secondary | ICD-10-CM

## 2020-12-24 DIAGNOSIS — Z833 Family history of diabetes mellitus: Secondary | ICD-10-CM | POA: Diagnosis not present

## 2020-12-24 DIAGNOSIS — Z8249 Family history of ischemic heart disease and other diseases of the circulatory system: Secondary | ICD-10-CM | POA: Diagnosis not present

## 2020-12-24 DIAGNOSIS — Z7984 Long term (current) use of oral hypoglycemic drugs: Secondary | ICD-10-CM | POA: Diagnosis not present

## 2020-12-24 HISTORY — PX: XI ROBOTIC ASSISTED INGUINAL HERNIA REPAIR WITH MESH: SHX6706

## 2020-12-24 LAB — GLUCOSE, CAPILLARY
Glucose-Capillary: 249 mg/dL — ABNORMAL HIGH (ref 70–99)
Glucose-Capillary: 231 mg/dL — ABNORMAL HIGH (ref 70–99)
Glucose-Capillary: 205 mg/dL — ABNORMAL HIGH (ref 70–99)

## 2020-12-24 SURGERY — REPAIR, HERNIA, INGUINAL, ROBOT-ASSISTED, LAPAROSCOPIC, USING MESH
Anesthesia: General | Site: Groin | Laterality: Left

## 2020-12-24 MED ORDER — GABAPENTIN 300 MG PO CAPS
300.0000 mg | ORAL_CAPSULE | ORAL | Status: AC
  Administered 2020-12-24: 300 mg via ORAL

## 2020-12-24 MED ORDER — PROPOFOL 10 MG/ML IV BOLUS
INTRAVENOUS | Status: DC | PRN
  Administered 2020-12-24: 150 mg via INTRAVENOUS

## 2020-12-24 MED ORDER — ORAL CARE MOUTH RINSE: 15.0000 mL | Freq: Once | OROMUCOSAL | Status: AC

## 2020-12-24 MED ORDER — CHLORHEXIDINE GLUCONATE 0.12 % MT SOLN
15.0000 mL | Freq: Once | OROMUCOSAL | Status: AC
  Administered 2020-12-24: 15 mL via OROMUCOSAL

## 2020-12-24 MED ORDER — CEFAZOLIN SODIUM-DEXTROSE 2-4 GM/100ML-% IV SOLN
2.0000 g | INTRAVENOUS | Status: AC
  Administered 2020-12-24: 2 g via INTRAVENOUS

## 2020-12-24 MED ORDER — MIDAZOLAM HCL 2 MG/2ML IJ SOLN
INTRAMUSCULAR | Status: AC
  Filled 2020-12-24: qty 2

## 2020-12-24 MED ORDER — SODIUM CHLORIDE 0.9 % IV SOLN: INTRAVENOUS | Status: DC

## 2020-12-24 MED ORDER — SUGAMMADEX SODIUM 500 MG/5ML IV SOLN
INTRAVENOUS | Status: DC | PRN
  Administered 2020-12-24: 400 mg via INTRAVENOUS

## 2020-12-24 MED ORDER — BUPIVACAINE HCL (PF) 0.5 % IJ SOLN
INTRAMUSCULAR | Status: AC
  Filled 2020-12-24: qty 30

## 2020-12-24 MED ORDER — CHLORHEXIDINE GLUCONATE 0.12 % MT SOLN
OROMUCOSAL | Status: AC
  Filled 2020-12-24: qty 15

## 2020-12-24 MED ORDER — EPINEPHRINE PF 1 MG/ML IJ SOLN
INTRAMUSCULAR | Status: AC
  Filled 2020-12-24: qty 1

## 2020-12-24 MED ORDER — GABAPENTIN 300 MG PO CAPS
ORAL_CAPSULE | ORAL | Status: AC
  Filled 2020-12-24: qty 1

## 2020-12-24 MED ORDER — CEFAZOLIN SODIUM-DEXTROSE 2-4 GM/100ML-% IV SOLN
INTRAVENOUS | Status: AC
  Filled 2020-12-24: qty 100

## 2020-12-24 MED ORDER — BUPIVACAINE LIPOSOME 1.3 % IJ SUSP
INTRAMUSCULAR | Status: DC | PRN
  Administered 2020-12-24: 20 mL

## 2020-12-24 MED ORDER — ROCURONIUM BROMIDE 100 MG/10ML IV SOLN
INTRAVENOUS | Status: DC | PRN
  Administered 2020-12-24: 10 mg via INTRAVENOUS
  Administered 2020-12-24: 40 mg via INTRAVENOUS
  Administered 2020-12-24: 30 mg via INTRAVENOUS
  Administered 2020-12-24: 50 mg via INTRAVENOUS

## 2020-12-24 MED ORDER — ACETAMINOPHEN 500 MG PO TABS
1000.0000 mg | ORAL_TABLET | ORAL | Status: AC
  Administered 2020-12-24: 1000 mg via ORAL

## 2020-12-24 MED ORDER — ACETAMINOPHEN 500 MG PO TABS
ORAL_TABLET | ORAL | Status: AC
  Filled 2020-12-24: qty 2

## 2020-12-24 MED ORDER — BUPIVACAINE-EPINEPHRINE 0.5% -1:200000 IJ SOLN
INTRAMUSCULAR | Status: DC | PRN
  Administered 2020-12-24: 12 mL

## 2020-12-24 MED ORDER — FENTANYL CITRATE (PF) 100 MCG/2ML IJ SOLN
INTRAMUSCULAR | Status: AC
  Filled 2020-12-24: qty 4

## 2020-12-24 MED ORDER — HYDROCODONE-ACETAMINOPHEN 5-325 MG PO TABS: 1.0000 | ORAL_TABLET | Freq: Four times a day (QID) | ORAL | 0 refills | Status: AC | PRN

## 2020-12-24 MED ORDER — ONDANSETRON HCL 4 MG/2ML IJ SOLN
INTRAMUSCULAR | Status: DC | PRN
  Administered 2020-12-24: 4 mg via INTRAVENOUS

## 2020-12-24 MED ORDER — MIDAZOLAM HCL 2 MG/2ML IJ SOLN
INTRAMUSCULAR | Status: DC | PRN
  Administered 2020-12-24: 2 mg via INTRAVENOUS

## 2020-12-24 MED ORDER — DOCUSATE SODIUM 100 MG PO CAPS: 100.0000 mg | ORAL_CAPSULE | Freq: Two times a day (BID) | ORAL | 0 refills | Status: AC | PRN

## 2020-12-24 MED ORDER — ALBUTEROL SULFATE HFA 108 (90 BASE) MCG/ACT IN AERS
INHALATION_SPRAY | RESPIRATORY_TRACT | Status: DC | PRN
  Administered 2020-12-24: 5 via RESPIRATORY_TRACT

## 2020-12-24 MED ORDER — BUPIVACAINE LIPOSOME 1.3 % IJ SUSP
INTRAMUSCULAR | Status: AC
  Filled 2020-12-24: qty 20

## 2020-12-24 MED ORDER — FAMOTIDINE 20 MG PO TABS: 20.0000 mg | ORAL_TABLET | Freq: Once | ORAL | Status: AC

## 2020-12-24 MED ORDER — ACETAMINOPHEN 325 MG PO TABS: 650.0000 mg | ORAL_TABLET | Freq: Three times a day (TID) | ORAL | 0 refills | Status: AC | PRN

## 2020-12-24 MED ORDER — CELECOXIB 200 MG PO CAPS
ORAL_CAPSULE | ORAL | Status: AC
  Filled 2020-12-24: qty 1

## 2020-12-24 MED ORDER — IPRATROPIUM-ALBUTEROL 0.5-2.5 (3) MG/3ML IN SOLN: 3.0000 mL | Freq: Once | RESPIRATORY_TRACT | Status: AC

## 2020-12-24 MED ORDER — PROPOFOL 10 MG/ML IV BOLUS
INTRAVENOUS | Status: AC
  Filled 2020-12-24: qty 20

## 2020-12-24 MED ORDER — IBUPROFEN 800 MG PO TABS: 800.0000 mg | ORAL_TABLET | Freq: Three times a day (TID) | ORAL | 0 refills | Status: AC | PRN

## 2020-12-24 MED ORDER — CHLORHEXIDINE GLUCONATE CLOTH 2 % EX PADS
6.0000 | MEDICATED_PAD | Freq: Once | CUTANEOUS | Status: AC
  Administered 2020-12-24: 6 via TOPICAL

## 2020-12-24 MED ORDER — LIDOCAINE HCL (CARDIAC) PF 100 MG/5ML IV SOSY
PREFILLED_SYRINGE | INTRAVENOUS | Status: DC | PRN
  Administered 2020-12-24: 100 mg via INTRAVENOUS

## 2020-12-24 MED ORDER — DEXAMETHASONE SODIUM PHOSPHATE 10 MG/ML IJ SOLN
INTRAMUSCULAR | Status: DC | PRN
  Administered 2020-12-24: 10 mg via INTRAVENOUS

## 2020-12-24 MED ORDER — SUCCINYLCHOLINE CHLORIDE 20 MG/ML IJ SOLN
INTRAMUSCULAR | Status: DC | PRN
  Administered 2020-12-24: 120 mg via INTRAVENOUS

## 2020-12-24 MED ORDER — FENTANYL CITRATE (PF) 100 MCG/2ML IJ SOLN
INTRAMUSCULAR | Status: DC | PRN
  Administered 2020-12-24 (×4): 50 ug via INTRAVENOUS

## 2020-12-24 MED ORDER — IPRATROPIUM-ALBUTEROL 0.5-2.5 (3) MG/3ML IN SOLN
RESPIRATORY_TRACT | Status: AC
  Administered 2020-12-24: 3 mL via RESPIRATORY_TRACT
  Filled 2020-12-24: qty 3

## 2020-12-24 MED ORDER — CELECOXIB 200 MG PO CAPS
200.0000 mg | ORAL_CAPSULE | ORAL | Status: AC
  Administered 2020-12-24: 200 mg via ORAL

## 2020-12-24 MED ORDER — FAMOTIDINE 20 MG PO TABS
ORAL_TABLET | ORAL | Status: AC
  Administered 2020-12-24: 20 mg via ORAL
  Filled 2020-12-24: qty 1

## 2020-12-24 SURGICAL SUPPLY — 48 items
ADH SKN CLS APL DERMABOND .7 (GAUZE/BANDAGES/DRESSINGS) ×1
APL PRP STRL LF DISP 70% ISPRP (MISCELLANEOUS) ×1
BAG INFUSER PRESSURE 100CC (MISCELLANEOUS) IMPLANT
BLADE SURG SZ11 CARB STEEL (BLADE) ×2 IMPLANT
BNDG GAUZE 4.5X4.1 6PLY STRL (MISCELLANEOUS) ×2 IMPLANT
CANISTER SUCT 1200ML W/VALVE (MISCELLANEOUS) IMPLANT
CHLORAPREP W/TINT 26 (MISCELLANEOUS) ×2 IMPLANT
COVER TIP SHEARS 8 DVNC (MISCELLANEOUS) ×1 IMPLANT
COVER TIP SHEARS 8MM DA VINCI (MISCELLANEOUS) ×1
COVER WAND RF STERILE (DRAPES) IMPLANT
DEFOGGER SCOPE WARMER CLEARIFY (MISCELLANEOUS) ×2 IMPLANT
DERMABOND ADVANCED (GAUZE/BANDAGES/DRESSINGS) ×1
DERMABOND ADVANCED .7 DNX12 (GAUZE/BANDAGES/DRESSINGS) ×1 IMPLANT
DRAPE ARM DVNC X/XI (DISPOSABLE) ×3 IMPLANT
DRAPE COLUMN DVNC XI (DISPOSABLE) ×1 IMPLANT
DRAPE DA VINCI XI ARM (DISPOSABLE) ×3
DRAPE DA VINCI XI COLUMN (DISPOSABLE) ×1
ELECT CAUTERY BLADE 6.4 (BLADE) IMPLANT
ELECT REM PT RETURN 9FT ADLT (ELECTROSURGICAL) ×2
ELECTRODE REM PT RTRN 9FT ADLT (ELECTROSURGICAL) ×1 IMPLANT
GLOVE SURG SYN 6.5 ES PF (GLOVE) ×12 IMPLANT
GLOVE SURG UNDER POLY LF SZ7 (GLOVE) ×4 IMPLANT
GOWN STRL REUS W/ TWL LRG LVL3 (GOWN DISPOSABLE) ×5 IMPLANT
GOWN STRL REUS W/TWL LRG LVL3 (GOWN DISPOSABLE) ×10
IRRIGATOR SUCT 8 DISP DVNC XI (IRRIGATION / IRRIGATOR) IMPLANT
IRRIGATOR SUCTION 8MM XI DISP (IRRIGATION / IRRIGATOR)
IV NS 1000ML (IV SOLUTION)
IV NS 1000ML BAXH (IV SOLUTION) IMPLANT
LABEL OR SOLS (LABEL) IMPLANT
MANIFOLD NEPTUNE II (INSTRUMENTS) ×2 IMPLANT
MESH 3DMAX 4X6 LT LRG (Mesh General) ×1 IMPLANT
MESH 3DMAX MID 4X6 LT LRG (Mesh General) ×1 IMPLANT
NEEDLE HYPO 22GX1.5 SAFETY (NEEDLE) ×2 IMPLANT
NEEDLE INSUFFLATION 14GA 120MM (NEEDLE) ×2 IMPLANT
OBTURATOR OPTICAL STANDARD 8MM (TROCAR) ×1
OBTURATOR OPTICAL STND 8 DVNC (TROCAR) ×1
OBTURATOR OPTICALSTD 8 DVNC (TROCAR) ×1 IMPLANT
PACK LAP CHOLECYSTECTOMY (MISCELLANEOUS) ×2 IMPLANT
PENCIL ELECTRO HAND CTR (MISCELLANEOUS) ×2 IMPLANT
SEAL CANN UNIV 5-8 DVNC XI (MISCELLANEOUS) ×3 IMPLANT
SEAL XI 5MM-8MM UNIVERSAL (MISCELLANEOUS) ×3
SET TUBE SMOKE EVAC HIGH FLOW (TUBING) ×2 IMPLANT
SOLUTION ELECTROLUBE (MISCELLANEOUS) ×2 IMPLANT
SUT MNCRL AB 4-0 PS2 18 (SUTURE) ×2 IMPLANT
SUT VIC AB 2-0 SH 27 (SUTURE) ×2
SUT VIC AB 2-0 SH 27XBRD (SUTURE) ×1 IMPLANT
SUT VLOC 90 6 CV-15 VIOLET (SUTURE) ×4 IMPLANT
SYR 30ML LL (SYRINGE) ×2 IMPLANT

## 2020-12-24 NOTE — Anesthesia Postprocedure Evaluation (Signed)
Anesthesia Post Note  Patient: Lonnie Hernandez  Procedure(s) Performed: XI ROBOTIC ASSISTED INGUINAL HERNIA REPAIR WITH MESH (Left Groin)  Patient location during evaluation: PACU Anesthesia Type: General Level of consciousness: awake and alert Pain management: pain level controlled Vital Signs Assessment: post-procedure vital signs reviewed and stable Respiratory status: spontaneous breathing, nonlabored ventilation, respiratory function stable and patient connected to nasal cannula oxygen Cardiovascular status: blood pressure returned to baseline and stable Postop Assessment: no apparent nausea or vomiting Anesthetic complications: no   No complications documented.   Last Vitals:  Vitals:   12/24/20 1705 12/24/20 1727  BP: (!) 153/92 (!) 164/92  Pulse:  82  Resp: 16 16  Temp: 36.8 C 36.7 C  SpO2: 95% 96%    Last Pain:  Vitals:   12/24/20 1727  TempSrc: Temporal  PainSc: 2                  Lenard Simmer

## 2020-12-24 NOTE — Anesthesia Preprocedure Evaluation (Signed)
Anesthesia Evaluation  Patient identified by MRN, date of birth, ID band Patient awake    Reviewed: Allergy & Precautions, NPO status , Patient's Chart, lab work & pertinent test results  History of Anesthesia Complications Negative for: history of anesthetic complications  Airway Mallampati: III       Dental   Pulmonary neg sleep apnea, neg COPD, Current Smoker and Patient abstained from smoking.,           Cardiovascular hypertension, Pt. on medications and Pt. on home beta blockers (-) Past MI and (-) CHF (-) dysrhythmias (-) Valvular Problems/Murmurs     Neuro/Psych neg Seizures    GI/Hepatic Neg liver ROS, neg GERD  ,  Endo/Other  diabetes, Type 2, Oral Hypoglycemic Agents  Renal/GU      Musculoskeletal   Abdominal   Peds  Hematology   Anesthesia Other Findings   Reproductive/Obstetrics                             Anesthesia Physical Anesthesia Plan  ASA: III  Anesthesia Plan: General   Post-op Pain Management:    Induction: Intravenous  PONV Risk Score and Plan: 1 and Ondansetron  Airway Management Planned: Oral ETT  Additional Equipment:   Intra-op Plan:   Post-operative Plan:   Informed Consent: I have reviewed the patients History and Physical, chart, labs and discussed the procedure including the risks, benefits and alternatives for the proposed anesthesia with the patient or authorized representative who has indicated his/her understanding and acceptance.       Plan Discussed with:   Anesthesia Plan Comments:         Anesthesia Quick Evaluation

## 2020-12-24 NOTE — Op Note (Signed)
Preoperative diagnosis: LEFT inguinal Hernia.  Postoperative diagnosis: left, incarcerated inguinal Hernia  Procedure: Robotic assisted laparoscopic left incarcerated inguinal hernia repair with mesh  Anesthesia: General  Surgeon: Dr. Tonna Boehringer  Wound Classification: Clean  Specimen: none  Complications: None  Estimated Blood Loss: 70mL   Indications:  inguinal hernia. Repair was indicated to avoid complications of incarceration, obstruction and pain, and a prosthetic mesh repair was elected.  See H&P for further details.  Findings: 1. Vas Deferens and cord structures identified and preserved 2. Bard 3D max medium weight mesh used for repair 3. Adequate hemostasis achieved  Description of procedure: The patient was taken to the operating room. A time-out was completed verifying correct patient, procedure, site, positioning, and implant(s) and/or special equipment prior to beginning this procedure.  Area was prepped and draped in the usual sterile fashion. An incision was marked 20 cm above the pubic tubercle, slightly above the umbilicus  Scrotum wrapped in Kerlix roll.  Veress needle inserted at palmer's point.  Saline drop test noted to be positive with gradual increase in pressure after initiation of gas insufflation.  15 mm of pressure was achieved prior to removing the Veress needle and then placing a 8 mm port via the Optiview technique through the supraumbilical site.  Inspection of the area afterwards noted no injury to the surrounding organs during insertion of the needle and the port.  2 port sites were marked 8 cm to the lateral sides of the initial port, and a 8 mm robotic port was placed on the left side, another 8 mm robotic port on the right side under direct supervision.  Local anesthesia  infused to the preplanned incision sites prior to insertion of the port.  The [employer] platform was then brought into the operative field and docked to the ports.  Examination of the  abdominal cavity noted a left inguinal hernia.  A peritoneal flap was created approximately 8cm cephalad to the defect by using scissors with electrocautery.  Dissection was carried down towards the pubic tubercle, developing the myopectineal orifice view.  Laterally the flap was carried towards the ASIS.  Small hernia sac was noted, densely adhesed to surrounding tissue which carefully dissected away from the adjacent tissues to be fully reduced out of hernia cavity.  Any bleeding was controlled with combination of electrocautery and manual pressure.    After confirming adequate dissection and the peritoneal reflection completely down and away from the cord structures, a Large Bard 3DMax medium weight mesh was placed within the anterior abdominal wall, secured in place using 2-0 Vicryl on an SH needle immediately above the pubic tubercle.  Care was noted to place redundant tissue flap created during dissection inbetween mesh and adominal walll nerves visible.  After noting proper placement of the mesh with the peritoneal reflection deep to it, the previously created peritoneal flap was secured back up to the anterior abdominal wall using running 3-0 V-Lock.  Small peritoneal hole closed with 3-0 vicryl to protect bowel from bowel.  Both needles were then removed out of the abdominal cavity, Xi platform undocked from the ports and removed off of operative field.  exparel infused as ilioinguinal block.  Abdomen then desufflated and ports removed. All the skin incisions were then closed with a subcuticular stitch of Monocryl 4-0. Dermabond was applied. The testis was gently pulled down into its anatomic position in the scrotum.  The patient tolerated the procedure well and was taken to the postanesthesia care unit in stable condition.  Sponge and instrument count correct at end of procedure. ]

## 2020-12-24 NOTE — Interval H&P Note (Signed)
History and Physical Interval Note:  12/24/2020 1:40 PM  MINOR IDEN  has presented today for surgery, with the diagnosis of K40.90 Non-recurrent unilateral inguinal hernia without obstruction or gangrene.  The various methods of treatment have been discussed with the patient and family. After consideration of risks, benefits and other options for treatment, the patient has consented to  Procedure(s): XI ROBOTIC ASSISTED INGUINAL HERNIA REPAIR WITH MESH (Left) as a surgical intervention.  The patient's history has been reviewed, patient examined, no change in status, stable for surgery.  I have reviewed the patient's chart and labs.  Questions were answered to the patient's satisfaction.     Nosson Wender Tonna Boehringer

## 2020-12-24 NOTE — Transfer of Care (Signed)
Immediate Anesthesia Transfer of Care Note  Patient: Lonnie Hernandez  Procedure(s) Performed: XI ROBOTIC ASSISTED INGUINAL HERNIA REPAIR WITH MESH (Left Groin)  Patient Location: PACU  Anesthesia Type:General  Level of Consciousness: sedated  Airway & Oxygen Therapy: Patient Spontanous Breathing and Patient connected to face mask oxygen  Post-op Assessment: Report given to RN and Post -op Vital signs reviewed and stable  Post vital signs: Reviewed and stable  Last Vitals:  Vitals Value Taken Time  BP    Temp    Pulse    Resp    SpO2      Last Pain:  Vitals:   12/24/20 1035  TempSrc: Temporal  PainSc: 2          Complications: No complications documented.

## 2020-12-24 NOTE — Discharge Instructions (Signed)
Hernia repair, Care After This sheet gives you information about how to care for yourself after your procedure. Your health care provider may also give you more specific instructions. If you have problems or questions, contact your health care provider. What can I expect after the procedure? After your procedure, it is common to have the following:  Pain in your abdomen, especially in the incision areas. You will be given medicine to control the pain.  Tiredness. This is a normal part of the recovery process. Your energy level will return to normal over the next several weeks.  Changes in your bowel movements, such as constipation or needing to go more often. Talk with your health care provider about how to manage this. Follow these instructions at home: Medicines  RESUME ASPIRIN IN 48HRS   tylenol and advil as needed for discomfort.  Please alternate between the two every four hours as needed for pain.     Use narcotics, if prescribed, only when tylenol and motrin is not enough to control pain.   325-650mg  every 8hrs to max of 3000mg /24hrs (including the 325mg  in every norco dose) for the tylenol.     Advil up to 800mg  per dose every 8hrs as needed for pain.    PLEASE RECORD NUMBER OF PILLS TAKEN UNTIL NEXT FOLLOW UP APPT.  THIS WILL HELP DETERMINE HOW READY YOU ARE TO BE RELEASED FROM ANY ACTIVITY RESTRICTIONS  Do not drive or use heavy machinery while taking prescription pain medicine.  Do not drink alcohol while taking prescription pain medicine.  Incision care     Follow instructions from your health care provider about how to take care of your incision areas. Make sure you: ? Keep your incisions clean and dry. ? Wash your hands with soap and water before and after applying medicine to the areas, and before and after changing your bandage (dressing). If soap and water are not available, use hand sanitizer. ? Change your dressing as told by your health care provider. ? Leave  stitches (sutures), skin glue, or adhesive strips in place. These skin closures may need to stay in place for 2 weeks or longer. If adhesive strip edges start to loosen and curl up, you may trim the loose edges. Do not remove adhesive strips completely unless your health care provider tells you to do that.  Do not wear tight clothing over the incisions. Tight clothing may rub and irritate the incision areas, which may cause the incisions to open.  Do not take baths, swim, or use a hot tub until your health care provider approves. OK TO SHOWER IN 24HRS.    Check your incision area every day for signs of infection. Check for: ? More redness, swelling, or pain. ? More fluid or blood. ? Warmth. ? Pus or a bad smell. Activity  Avoid lifting anything that is heavier than 10 lb (4.5 kg) for 2 weeks or until your health care provider says it is okay.  No pushing/pulling greater than 30lbs  You may resume normal activities as told by your health care provider. Ask your health care provider what activities are safe for you.  Take rest breaks during the day as needed. Eating and drinking  Follow instructions from your health care provider about what you can eat after surgery.  To prevent or treat constipation while you are taking prescription pain medicine, your health care provider may recommend that you: ? Drink enough fluid to keep your urine clear or pale yellow. ? Take  over-the-counter or prescription medicines. ? Eat foods that are high in fiber, such as fresh fruits and vegetables, whole grains, and beans. ? Limit foods that are high in fat and processed sugars, such as fried and sweet foods. General instructions  Ask your health care provider when you will need an appointment to get your sutures or staples removed.  Keep all follow-up visits as told by your health care provider. This is important. Contact a health care provider if:  You have more redness, swelling, or pain around  your incisions.  You have more fluid or blood coming from the incisions.  Your incisions feel warm to the touch.  You have pus or a bad smell coming from your incisions or your dressing.  You have a fever.  You have an incision that breaks open (edges not staying together) after sutures or staples have been removed. Get help right away if:  You develop a rash.  You have chest pain or difficulty breathing.  You have pain or swelling in your legs.  You feel light-headed or you faint.  Your abdomen swells (becomes distended).  You have nausea or vomiting.  You have blood in your stool (feces). This information is not intended to replace advice given to you by your health care provider. Make sure you discuss any questions you have with your health care provider. Document Released: 06/17/2005 Document Revised: 08/17/2018 Document Reviewed: 08/29/2016 Elsevier Interactive Patient Education  2019 [employer]  AMBULATORY SURGERY  DISCHARGE INSTRUCTIONS   1) The drugs that you were given will stay in your system until tomorrow so for the next 24 hours you should not:  A) Drive an automobile B) Make any legal decisions C) Drink any alcoholic beverage   2) You may resume regular meals tomorrow.  Today it is better to start with liquids and gradually work up to solid foods.  You may eat anything you prefer, but it is better to start with liquids, then soup and crackers, and gradually work up to solid foods.   3) Please notify your doctor immediately if you have any unusual bleeding, trouble breathing, redness and pain at the surgery site, drainage, fever, or pain not relieved by medication.    4) Additional Instructions:  Do not remove green band for 4 days      Please contact your physician with any problems or Same Day Surgery at [phone], Monday through Friday 6 am to 4 pm, or [place] at [facility] number at [phone].

## 2020-12-24 NOTE — Anesthesia Procedure Notes (Signed)
Procedure Name: Intubation Date/Time: 12/24/2020 1:49 PM Performed by: Nelda Marseille, CRNA Pre-anesthesia Checklist: Patient identified, Patient being monitored, Timeout performed, Emergency Drugs available and Suction available Patient Re-evaluated:Patient Re-evaluated prior to induction Oxygen Delivery Method: Circle system utilized Preoxygenation: Pre-oxygenation with 100% oxygen Induction Type: IV induction Ventilation: Mask ventilation without difficulty Laryngoscope Size: Mac, 3 and McGraph Grade View: Grade I Tube type: Oral Tube size: 7.5 mm Number of attempts: 1 Airway Equipment and Method: Stylet Placement Confirmation: ETT inserted through vocal cords under direct vision,  positive ETCO2 and breath sounds checked- equal and bilateral Secured at: 21 cm Tube secured with: Tape Dental Injury: Teeth and Oropharynx as per pre-operative assessment

## 2020-12-25 ENCOUNTER — Encounter: Payer: Self-pay | Admitting: Surgery

## 2020-12-28 ENCOUNTER — Other Ambulatory Visit: Payer: Self-pay | Admitting: Surgery

## 2020-12-28 MED ORDER — ONDANSETRON 4 MG PO TBDP: 4.0000 mg | ORAL_TABLET | Freq: Three times a day (TID) | ORAL | 0 refills | Status: AC | PRN

## 2021-01-20 ENCOUNTER — Other Ambulatory Visit: Payer: Self-pay

## 2021-01-20 ENCOUNTER — Other Ambulatory Visit: Payer: Self-pay | Admitting: Physician Assistant

## 2021-01-20 ENCOUNTER — Ambulatory Visit
Admission: RE | Admit: 2021-01-20 | Discharge: 2021-01-20 | Disposition: A | Discharge status: Home / Self Care | Payer: No Typology Code available for payment source | Source: Ambulatory Visit | Attending: Physician Assistant | Admitting: Physician Assistant

## 2021-01-20 DIAGNOSIS — M7989 Other specified soft tissue disorders: Secondary | ICD-10-CM | POA: Diagnosis not present

## 2022-01-28 IMAGING — CR DG RIBS W/ CHEST 3+V*L*
1 series · 3 of 3 positions shown · non-contrast
Comparison: None.

CLINICAL DATA: Fell with left-sided rib pain.

EXAM:
LEFT RIBS AND CHEST - 3+ VIEW

[Series 1: dg ribs unilateral w/chest left · 0.14mm/px · 3 of 3 slices shown]
[im 1/3]
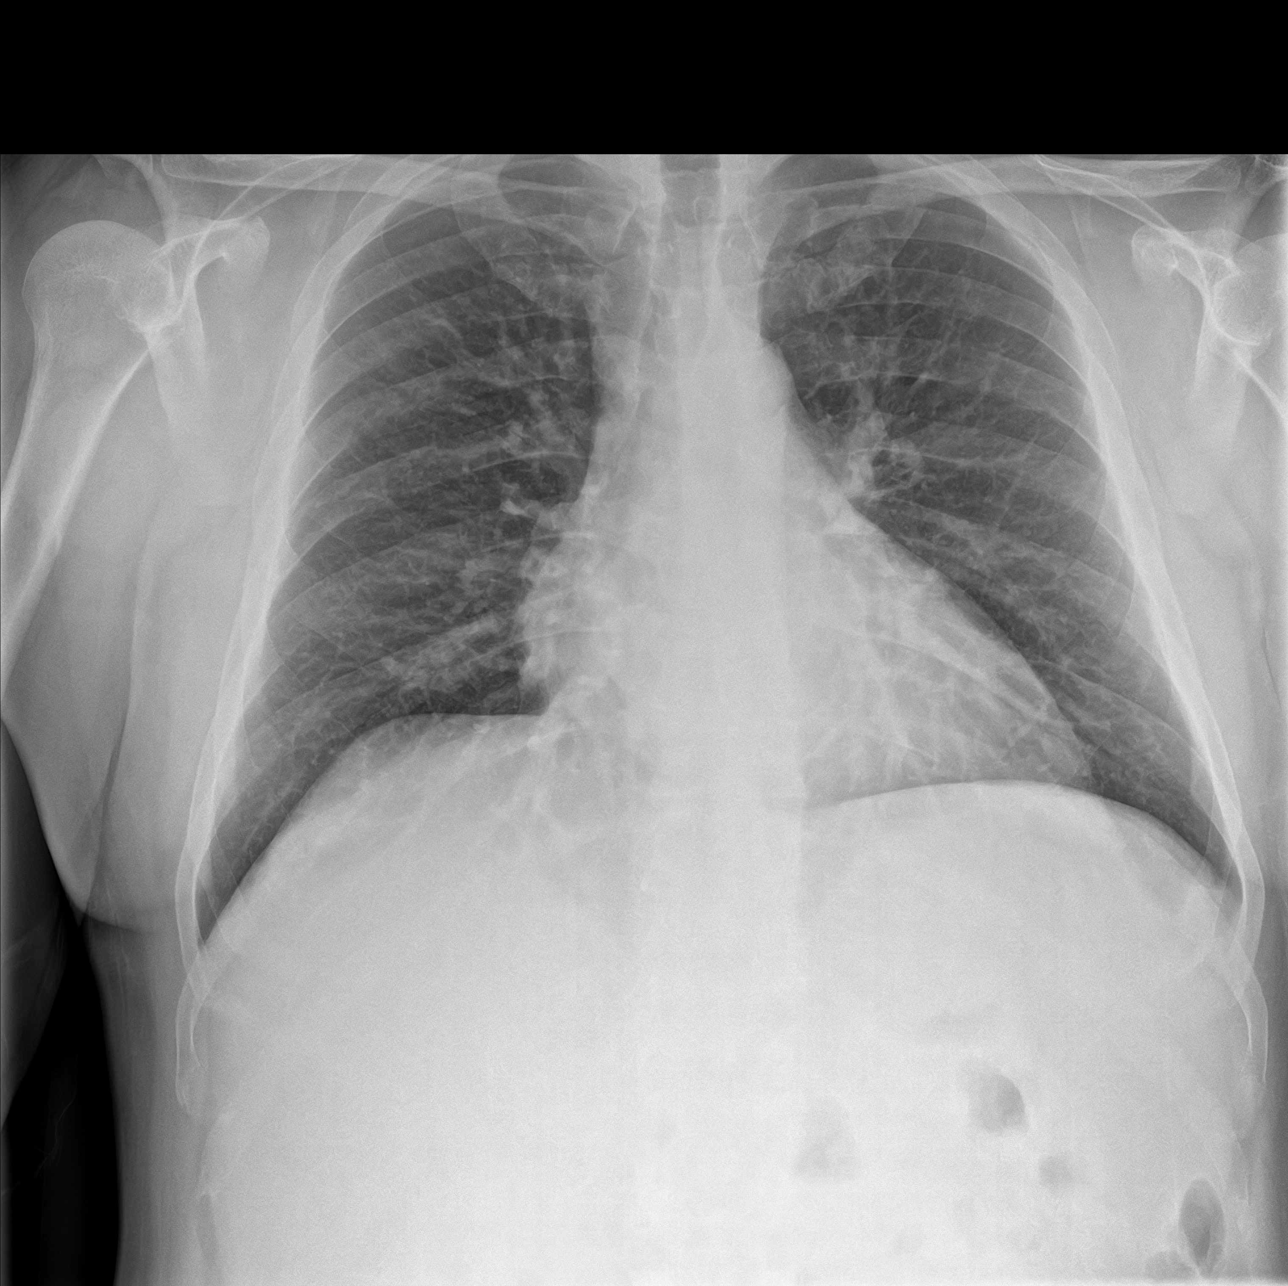
[im 2/3]
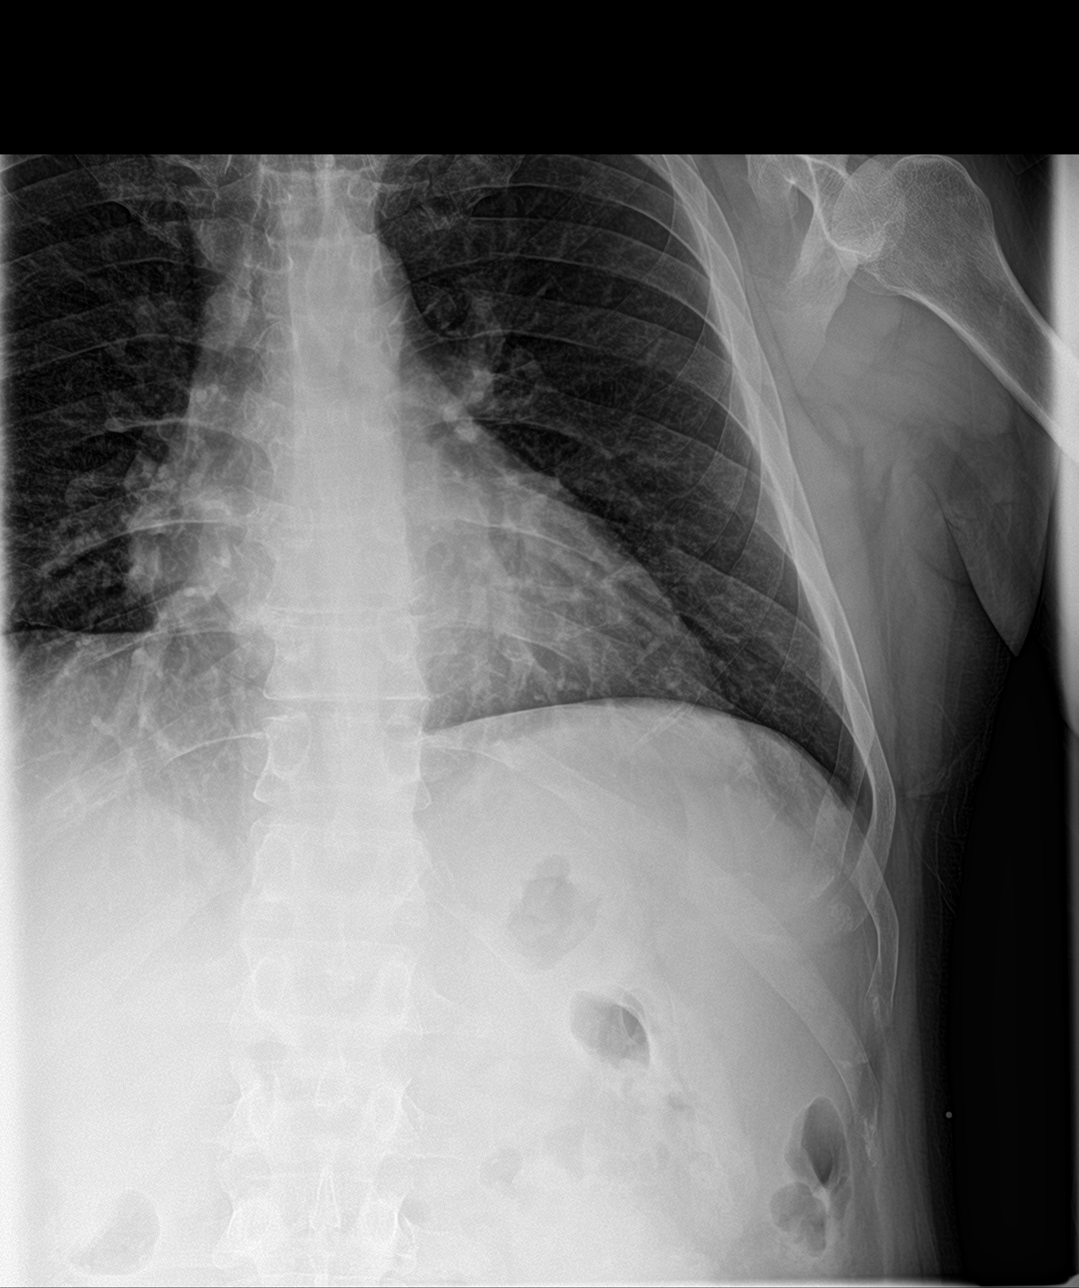
[im 3/3]
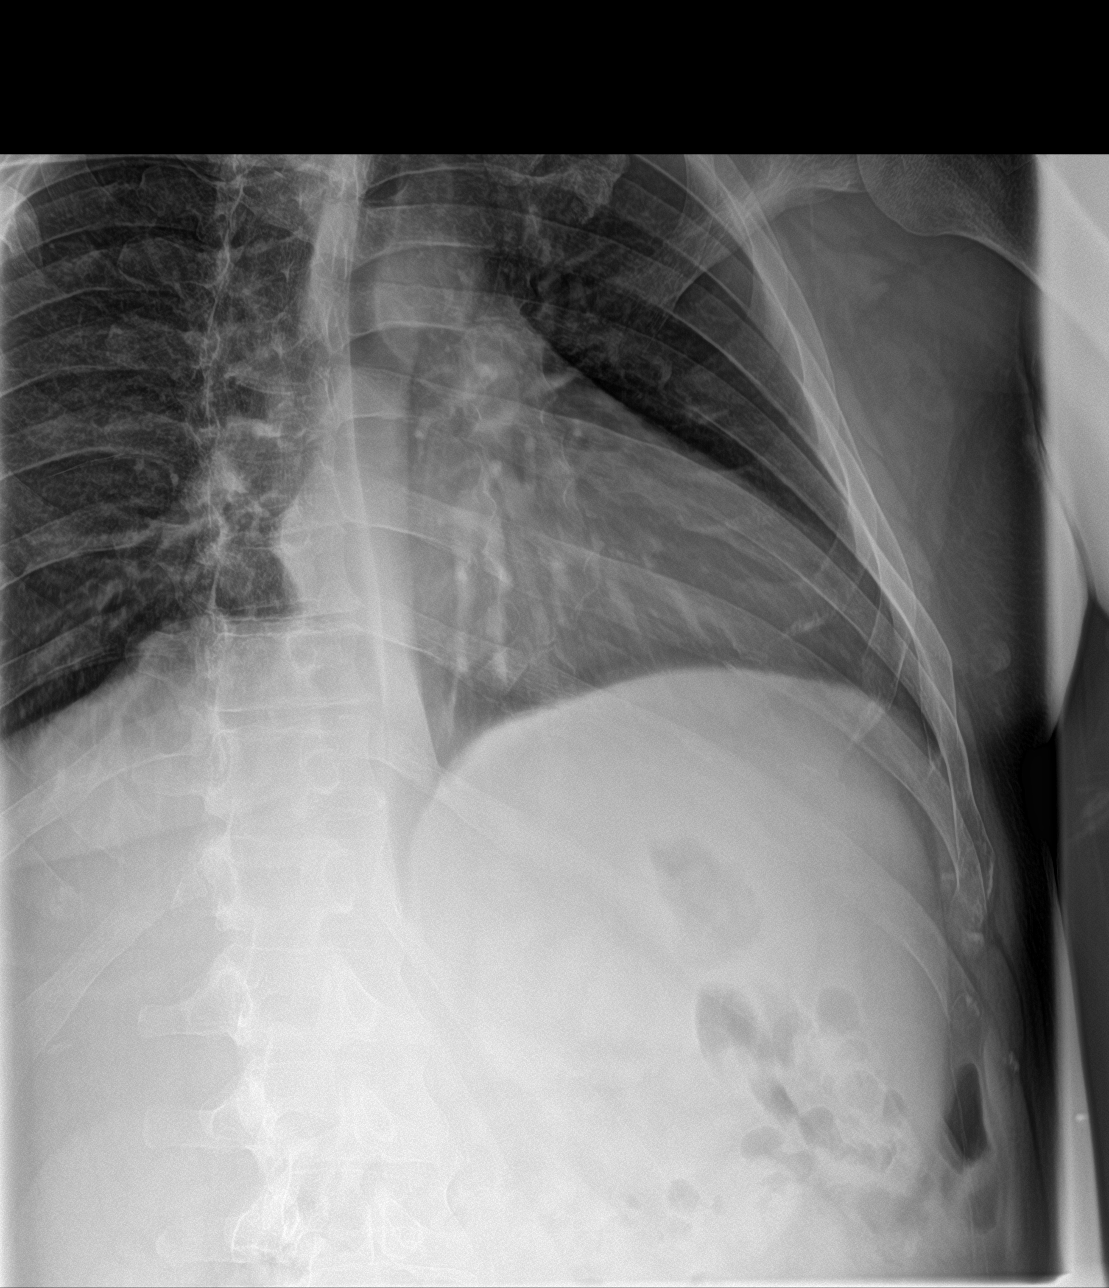

[3 of 3 positions shown; findings below may reference images not displayed]

FINDINGS: Heart and mediastinal shadows are normal. The lungs are clear. No
pneumothorax or hemothorax. Left rib films do not show any visible
fracture. Marker in place in the lower left lateral rib region.
IMPRESSION: No active cardiopulmonary disease. No visible rib fracture. Marker
in place at the left lower lateral rib region.

## 2022-02-10 ENCOUNTER — Ambulatory Visit
Admission: EM | Admit: 2022-02-10 | Discharge: 2022-02-10 | Disposition: A | Discharge status: Home / Self Care | Payer: No Typology Code available for payment source | Attending: Student | Admitting: Student

## 2022-02-10 ENCOUNTER — Other Ambulatory Visit: Payer: Self-pay

## 2022-02-10 ENCOUNTER — Encounter: Payer: Self-pay | Admitting: Emergency Medicine

## 2022-02-10 DIAGNOSIS — I10 Essential (primary) hypertension: Secondary | ICD-10-CM

## 2022-02-10 DIAGNOSIS — H6122 Impacted cerumen, left ear: Secondary | ICD-10-CM | POA: Diagnosis not present

## 2022-02-10 NOTE — ED Triage Notes (Signed)
Pt here with left ear fullness x 3 days ?

## 2022-02-10 NOTE — ED Provider Notes (Signed)
Renaldo Fiddler    CSN: [id number] Arrival date & time: 02/10/22  1305      History   Chief Complaint Chief Complaint  Patient presents with   Ear Fullness    HPI CASHAWN YANKO is a 51 y.o. male presenting with L ear fullness x3 days. History DVT, hypertension.  Describes bilateral ears feeling clogged for 3 days. Used hydrogen peroxide solution at home and got large chunk of earwax out of the R ear, but couldn't resolve the L ear. Denies dizziness, tinnitus. Denies recent URI. Denies cough, congestion, fevers/chills.   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) 09/27/2016   Essential hypertension 09/27/2016   Swelling of right lower extremity 09/27/2016    Past Surgical History:  Procedure Laterality Date   PERIPHERAL VASCULAR CATHETERIZATION Right 07/14/2016   Procedure: Thrombectomy;  Surgeon: Annice Needy, MD;  Location: [facility] INVASIVE CV [facility];  Service: Cardiovascular;  Laterality: Right;   PERIPHERAL VASCULAR CATHETERIZATION N/A 07/14/2016   Procedure: IVC Filter Insertion;  Surgeon: Annice Needy, MD;  Location: [facility] INV[facility]IVE CV [facility];  Service: Cardiovascular;  Laterality: N/A;   PERIPHERAL VASCULAR CATHETERIZATION N/A 10/03/2016   Procedure: IVC Filter Removal;  Surgeon: Annice Needy, MD;  Location: [facility] IN[facility] CV [facility];  Service: Cardiovascular;  Laterality: N/A;   XI ROBOTIC ASSISTED INGUINAL HERNIA REPAIR WITH MESH Left 12/24/2020   Procedure: XI ROBOTIC ASSISTED INGUINAL HERNIA REPAIR WITH MESH;  Surgeon: Sung Amabile, DO;  Location: [facility] ORS;  Service: General;  Laterality: Left;       Home Medications    Prior to Admission medications   Medication Sig Start Date End Date Taking?  Authorizing Provider  amLODipine (NORVASC) 2.5 MG tablet Take 2.5 mg by mouth daily.    [provider]  aspirin 81 MG chewable tablet Chew 81 mg by mouth daily.    [provider]  busPIRone (BUSPAR) 5 MG tablet Take 5 mg by mouth 2 (two) times daily.    [provider]  carvedilol (COREG CR) 20 MG 24 hr capsule Take 20 mg by mouth daily. 11/23/20   [provider]  citalopram (CELEXA) 20 MG tablet Take 20 mg by mouth daily.    [provider]  HYDROcodone-acetaminophen (NORCO) 5-325 MG tablet Take 1 tablet by mouth every 6 (six) hours as needed for up to 6 doses for moderate pain. 12/24/20   Tonna Boehringer, Isami, DO  ibuprofen (ADVIL) 800 MG tablet Take 1 tablet (800 mg total) by mouth every 8 (eight) hours as needed for mild pain or moderate pain. 12/24/20   Tonna Boehringer, Isami, DO  losartan (COZAAR) 100 MG tablet Take 100 mg by mouth daily. 06/21/16   [provider]  metFORMIN (GLUCOPHAGE) 500 MG tablet Take 500 mg by mouth daily with breakfast.    [provider]  ondansetron (ZOFRAN ODT) 4 MG disintegrating tablet Take 1 tablet (4 mg total) by mouth every 8 (eight) hours as needed for nausea or vomiting. 12/28/20   Tonna Boehringer, Isami, DO  sildenafil (VIAGRA) 100 MG tablet Take 100 mg by mouth as needed for erectile dysfunction. 10/01/20   [provider]    Family History Family  History  Problem Relation Age of Onset   Hypertension Mother    Liver disease Mother    Hypertension Father     Social History Social History   Tobacco Use   Smoking status: Every Day    Packs/day: 0.50    Years: 15.00    Pack years: 7.50    Types: Cigarettes   Smokeless tobacco: Former  Building services engineer Use: Never used  Substance Use Topics   Alcohol use: Yes    Alcohol/week: 12.0 standard drinks    Types: 12 Cans of beer per week   Drug use: No     Allergies   Patient has no known allergies.   Review of Systems Review of Systems   Constitutional:  Negative for appetite change, chills and fever.  HENT:  Positive for hearing loss. Negative for congestion, ear pain, rhinorrhea, sinus pressure, sinus pain and sore throat.   Eyes:  Negative for redness and visual disturbance.  Respiratory:  Negative for cough, chest tightness, shortness of breath and wheezing.   Cardiovascular:  Negative for chest pain and palpitations.  Gastrointestinal:  Negative for abdominal pain, constipation, diarrhea, nausea and vomiting.  Genitourinary:  Negative for dysuria, frequency and urgency.  Musculoskeletal:  Negative for myalgias.  Neurological:  Negative for dizziness, weakness and headaches.  Psychiatric/Behavioral:  Negative for confusion.   All other systems reviewed and are negative.   Physical Exam Triage Vital Signs ED Triage Vitals  Enc Vitals Group     BP 02/10/22 1321 (!) 178/94     Pulse Rate 02/10/22 1321 (!) 101     Resp 02/10/22 1321 18     Temp 02/10/22 1321 98.4 F (36.9 C)     Temp src --      SpO2 02/10/22 1321 98 %     Weight --      Height --      Head Circumference --      Peak Flow --      Pain Score 02/10/22 1331 0     Pain Loc --      Pain Edu? --      Excl. in GC? --    No data found.  Updated Vital Signs BP (!) 178/94    Pulse (!) 101    Temp 98.4 F (36.9 C)    Resp 18    SpO2 98%   Visual Acuity Right Eye Distance:   Left Eye Distance:   Bilateral Distance:    Right Eye Near:   Left Eye Near:    Bilateral Near:     Physical Exam Vitals reviewed.  Constitutional:      Appearance: Normal appearance. He is not ill-appearing.  HENT:     Head: Normocephalic and atraumatic.     Right Ear: Hearing, tympanic membrane, ear canal and external ear normal. No swelling or tenderness. No middle ear effusion. There is no impacted cerumen. No mastoid tenderness. Tympanic membrane is not injected, scarred, perforated, erythematous, retracted or bulging.     Left Ear: Hearing, tympanic membrane,  ear canal and external ear normal. No swelling or tenderness.  No middle ear effusion. There is impacted cerumen. No mastoid tenderness. Tympanic membrane is not injected, scarred, perforated, erythematous, retracted or bulging.     Ears:     Comments: L TM initially fully occluded by cerumen. Following lavage performed by nurse, TM appeared healthy and intact.     Mouth/Throat:     Pharynx: Oropharynx is clear. No  oropharyngeal exudate or posterior oropharyngeal erythema.  Cardiovascular:     Rate and Rhythm: Normal rate and regular rhythm.     Heart sounds: Normal heart sounds.  Pulmonary:     Effort: Pulmonary effort is normal.     Breath sounds: Normal breath sounds.  Lymphadenopathy:     Cervical: No cervical adenopathy.  Neurological:     General: No focal deficit present.     Mental Status: He is alert and oriented to person, place, and time.  Psychiatric:        Mood and Affect: Mood normal.        Behavior: Behavior normal.        Thought Content: Thought content normal.        Judgment: Judgment normal.     UC Treatments / Results  Labs (all labs ordered are listed, but only abnormal results are displayed) Labs Reviewed - No data to display  EKG   Radiology No results found.  Procedures Procedures (including critical care time)  Medications Ordered in UC Medications - No data to display  Initial Impression / Assessment and Plan / UC Course  I have reviewed the triage vital signs and the nursing notes.  Pertinent labs & imaging results that were available during my care of the patient were reviewed by me and considered in my medical decision making (see chart for details).     This patient is a very pleasant 50 y.o. year old male presenting with L cerumen impaction. L TM initially fully occluded by cerumen. Following lavage performed by nurse, TM appeared healthy and intact. Patient tolerated this well. For hypertension - he hasn't taken his antihypertensives  yet today; denies headaches, vision changes, dizziness, weakness, chest pain.  Final Clinical Impressions(s) / UC Diagnoses   Final diagnoses:  Left ear impacted cerumen  Essential hypertension     Discharge Instructions      -Please check your blood pressure at home or at the pharmacy. If this continues to be >140/90, follow-up with your primary care provider for further blood pressure management/ medication titration. If you develop chest pain, shortness of breath, vision changes, the worst headache of your life- head straight to the ED or call 911.      ED Prescriptions   None    PDMP not reviewed this encounter.   Rhys Martini, PA-C 02/10/22 1419

## 2022-02-10 NOTE — Discharge Instructions (Addendum)
-  Please check your blood pressure at home or at the pharmacy. If this continues to be >140/90, follow-up with your primary care provider for further blood pressure management/ medication titration. If you develop chest pain, shortness of breath, vision changes, the worst headache of your life- head straight to the ED or call 911.  

## 2022-04-22 IMAGING — US US EXTREM LOW VENOUS*R*
1 series · 13 of 24 positions shown · non-contrast
Comparison: Prior study on 04/13/2017

CLINICAL DATA: Right lower leg pain and edema.



[Series 1: us extrem low venous*right* · 0.07mm/px · 13 of 36 slices shown]
[im 1/36]
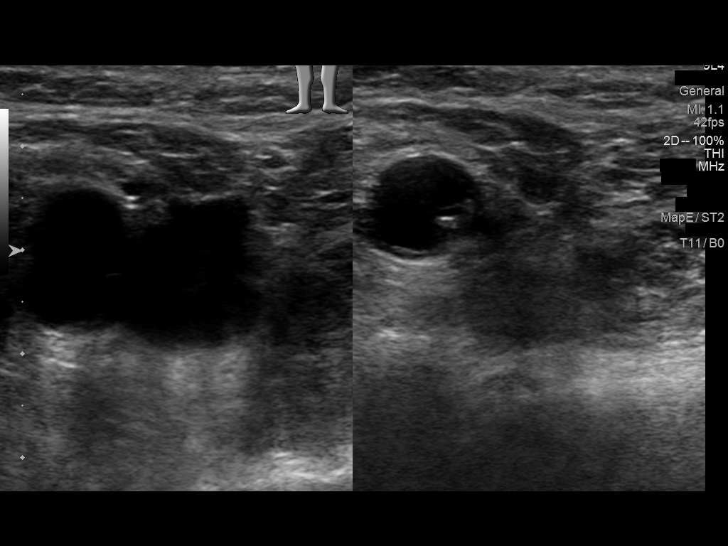
[im 4/36]
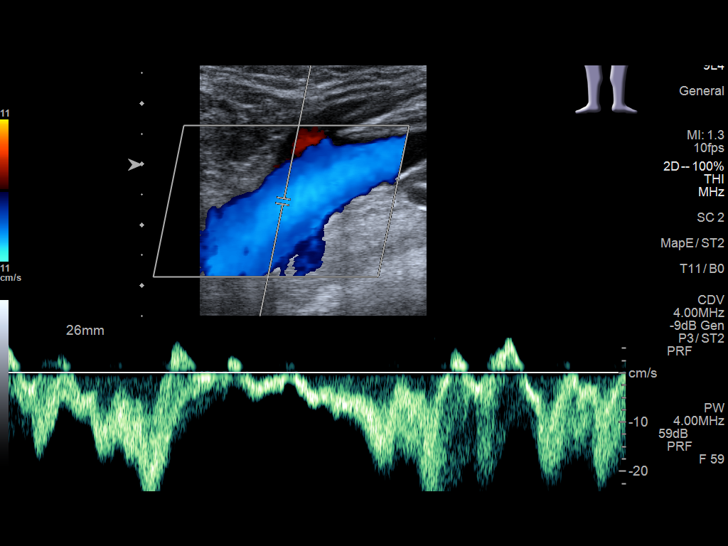
[im 7/36]
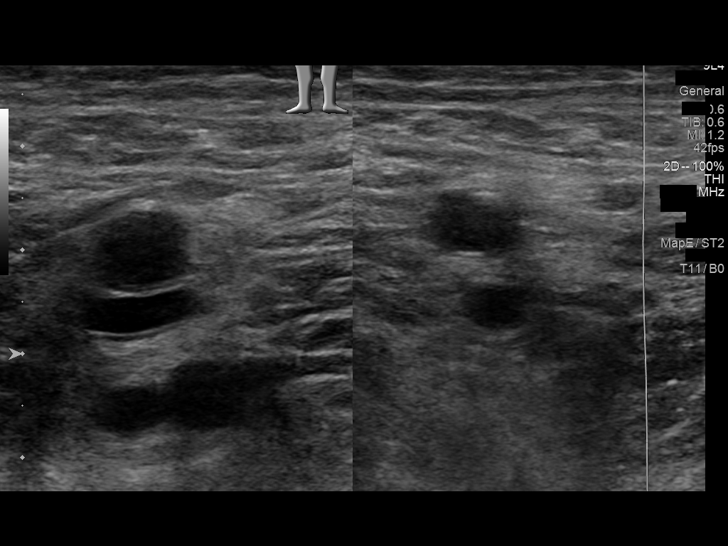
[im 10/36]
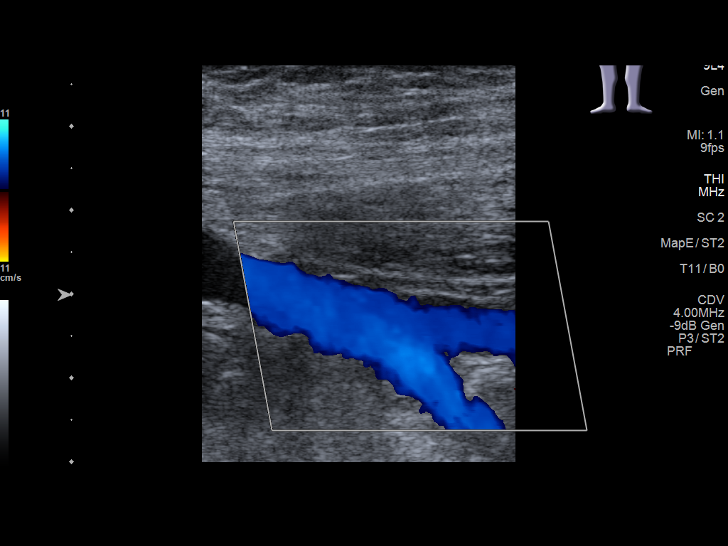
[im 13/36]
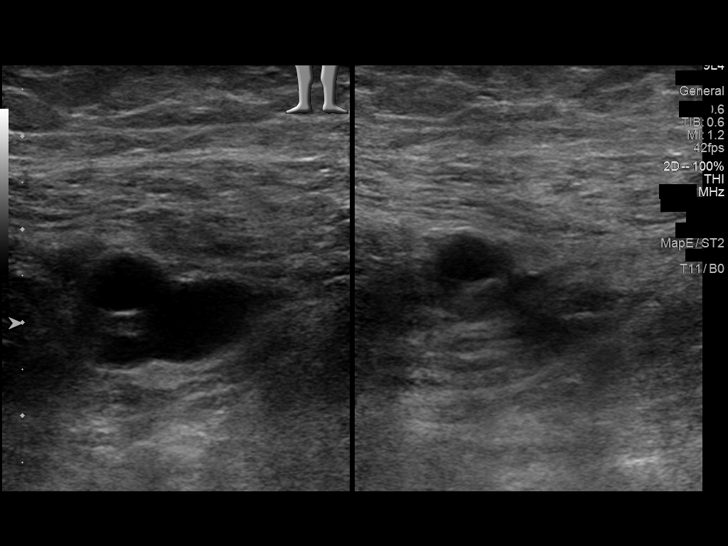
[im 16/36]
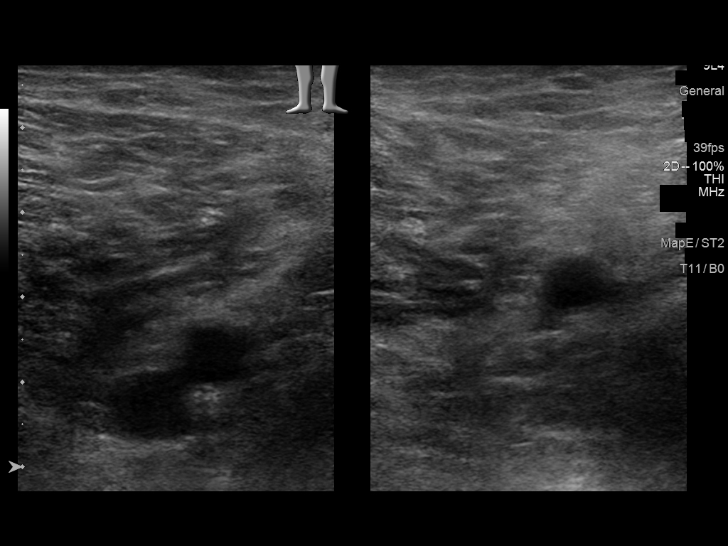
[im 19/36]
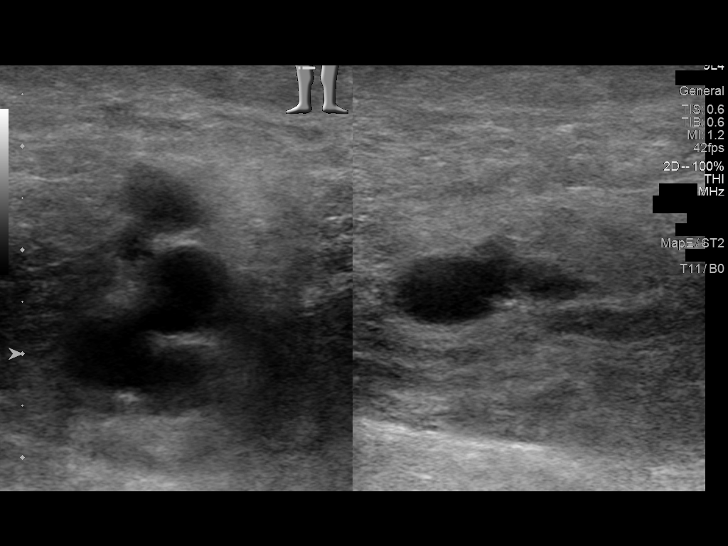
[im 20/36]
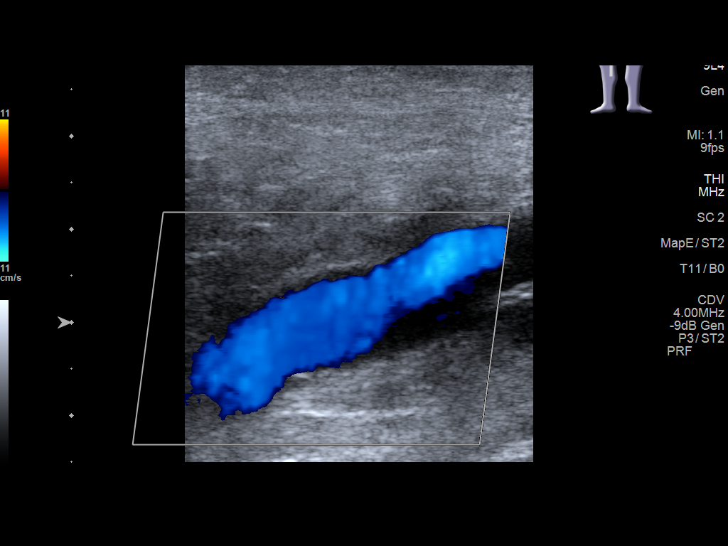
[im 23/36]
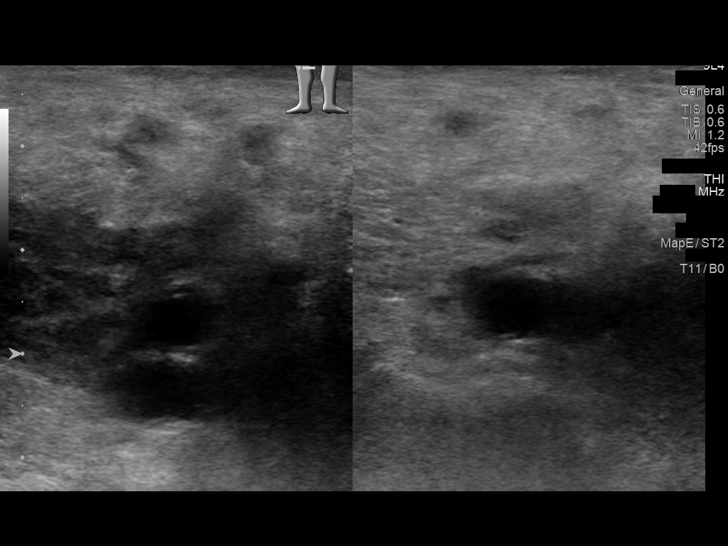
[im 26/36]
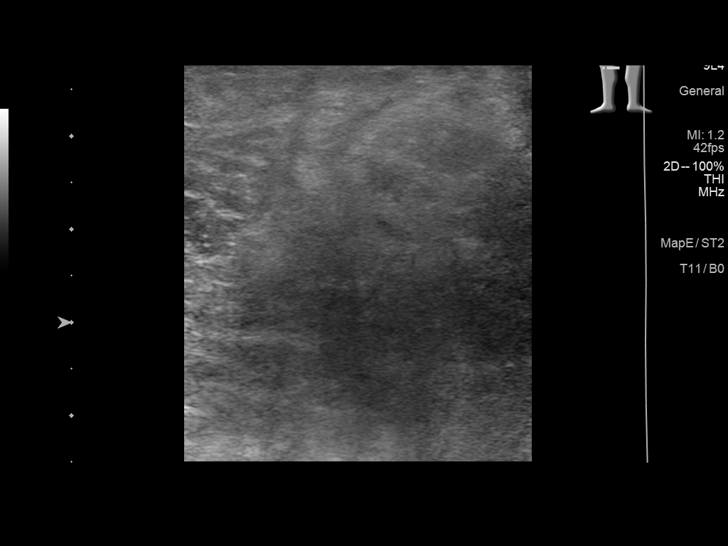
[im 29/36]
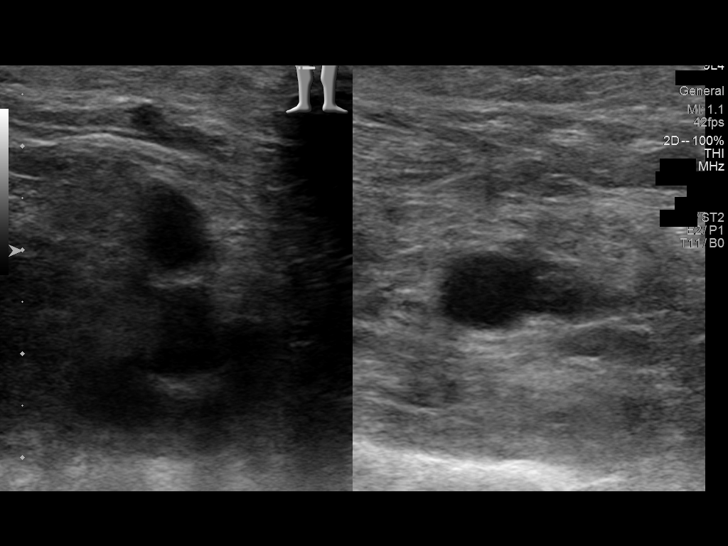
[im 32/36]
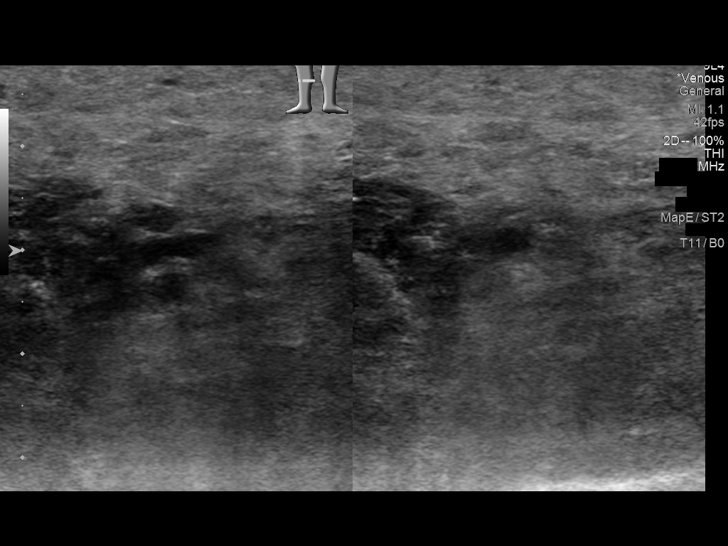
[im 36/36]
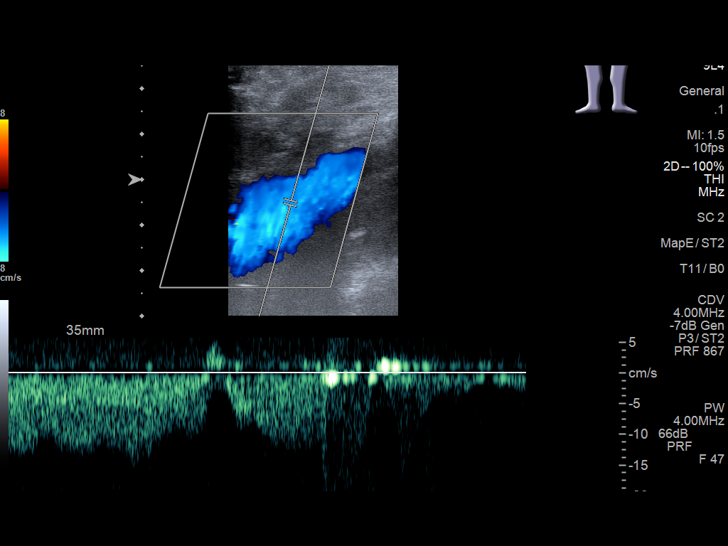

[13 of 24 positions shown; findings below may reference images not displayed]

FINDINGS: Contralateral Common Femoral Vein: Respiratory phasicity is normal
and symmetric with the symptomatic side. No evidence of thrombus.
Normal compressibility.

Common Femoral Vein: No evidence of thrombus. Normal
compressibility, respiratory phasicity and response to augmentation.

Saphenofemoral Junction: No evidence of thrombus. Normal
compressibility and flow on color Doppler imaging.

Profunda Femoral Vein: No evidence of thrombus. Normal
compressibility and flow on color Doppler imaging.

Femoral Vein: No evidence of thrombus. Normal compressibility,
respiratory phasicity and response to augmentation.

Popliteal Vein: No evidence of thrombus. Normal compressibility,
respiratory phasicity and response to augmentation.

Calf Veins: No evidence of thrombus. Normal compressibility and flow
on color Doppler imaging.

Superficial Great Saphenous Vein: No evidence of thrombus. Normal
compressibility.

Venous Reflux:  None.

Other Findings: No evidence of superficial thrombophlebitis or
abnormal fluid collection.
IMPRESSION: No evidence of right lower extremity deep venous thrombosis.

## 2022-08-05 ENCOUNTER — Other Ambulatory Visit: Payer: Self-pay | Admitting: Internal Medicine

## 2022-08-05 DIAGNOSIS — D696 Thrombocytopenia, unspecified: Secondary | ICD-10-CM

## 2022-08-05 DIAGNOSIS — F1090 Alcohol use, unspecified, uncomplicated: Secondary | ICD-10-CM

## 2022-08-12 ENCOUNTER — Ambulatory Visit: Payer: No Typology Code available for payment source

## 2022-09-23 ENCOUNTER — Emergency Department: Payer: Self-pay

## 2022-09-23 ENCOUNTER — Other Ambulatory Visit: Payer: Self-pay

## 2022-09-23 ENCOUNTER — Emergency Department
Admission: EM | Admit: 2022-09-23 | Discharge: 2022-09-23 | Disposition: A | Discharge status: Home / Self Care | Payer: Self-pay | Attending: Student in an Organized Health Care Education/Training Program | Admitting: Student in an Organized Health Care Education/Training Program

## 2022-09-23 ENCOUNTER — Encounter: Payer: Self-pay | Admitting: Intensive Care

## 2022-09-23 DIAGNOSIS — Z23 Encounter for immunization: Secondary | ICD-10-CM | POA: Insufficient documentation

## 2022-09-23 DIAGNOSIS — T148XXA Other injury of unspecified body region, initial encounter: Secondary | ICD-10-CM

## 2022-09-23 DIAGNOSIS — Z7984 Long term (current) use of oral hypoglycemic drugs: Secondary | ICD-10-CM | POA: Insufficient documentation

## 2022-09-23 DIAGNOSIS — W1830XA Fall on same level, unspecified, initial encounter: Secondary | ICD-10-CM | POA: Insufficient documentation

## 2022-09-23 DIAGNOSIS — S0081XA Abrasion of other part of head, initial encounter: Secondary | ICD-10-CM | POA: Insufficient documentation

## 2022-09-23 DIAGNOSIS — E119 Type 2 diabetes mellitus without complications: Secondary | ICD-10-CM | POA: Insufficient documentation

## 2022-09-23 DIAGNOSIS — W19XXXA Unspecified fall, initial encounter: Secondary | ICD-10-CM

## 2022-09-23 LAB — CBC WITH DIFFERENTIAL/PLATELET
Abs Immature Granulocytes: 0.03 10*3/uL (ref 0.00–0.07)
Basophils Absolute: 0.1 10*3/uL (ref 0.0–0.1)
Basophils Relative: 2 %
Eosinophils Absolute: 0.3 10*3/uL (ref 0.0–0.5)
Eosinophils Relative: 4 %
HCT: 41.5 % (ref 39.0–52.0)
Hemoglobin: 13.8 g/dL (ref 13.0–17.0)
Immature Granulocytes: 0 %
Lymphocytes Relative: 27 %
Lymphs Abs: 2.1 10*3/uL (ref 0.7–4.0)
MCH: 33.1 pg (ref 26.0–34.0)
MCHC: 33.3 g/dL (ref 30.0–36.0)
MCV: 99.5 fL (ref 80.0–100.0)
Monocytes Absolute: 0.7 10*3/uL (ref 0.1–1.0)
Monocytes Relative: 9 %
Neutro Abs: 4.5 10*3/uL (ref 1.7–7.7)
Neutrophils Relative %: 58 %
Platelets: 103 10*3/uL — ABNORMAL LOW (ref 150–400)
RBC: 4.17 MIL/uL — ABNORMAL LOW (ref 4.22–5.81)
RDW: 13 % (ref 11.5–15.5)
WBC: 7.8 10*3/uL (ref 4.0–10.5)
nRBC: 0 % (ref 0.0–0.2)

## 2022-09-23 LAB — COMPREHENSIVE METABOLIC PANEL
ALT: 19 U/L (ref 0–44)
AST: 29 U/L (ref 15–41)
Albumin: 3.3 g/dL — ABNORMAL LOW (ref 3.5–5.0)
Alkaline Phosphatase: 92 U/L (ref 38–126)
Anion gap: 5 (ref 5–15)
BUN: 11 mg/dL (ref 6–20)
CO2: 27 mmol/L (ref 22–32)
Calcium: 9.2 mg/dL (ref 8.9–10.3)
Chloride: 107 mmol/L (ref 98–111)
Creatinine, Ser: 0.81 mg/dL (ref 0.61–1.24)
GFR, Estimated: >60 mL/min (ref >60)
Glucose, Bld: 124 mg/dL — ABNORMAL HIGH (ref 70–99)
Potassium: 3.8 mmol/L (ref 3.5–5.1)
Sodium: 139 mmol/L (ref 135–145)
Total Bilirubin: 1.5 mg/dL — ABNORMAL HIGH (ref 0.3–1.2)
Total Protein: 7.9 g/dL (ref 6.5–8.1)

## 2022-09-23 MED ORDER — TETANUS-DIPHTH-ACELL PERTUSSIS 5-2.5-18.5 LF-MCG/0.5 IM SUSY
0.5000 mL | PREFILLED_SYRINGE | Freq: Once | INTRAMUSCULAR | Status: AC
  Administered 2022-09-23: 0.5 mL via INTRAMUSCULAR
  Filled 2022-09-23: qty 0.5

## 2022-09-23 MED ORDER — BACITRACIN ZINC 500 UNIT/GM EX OINT
TOPICAL_OINTMENT | Freq: Once | CUTANEOUS | Status: AC
  Administered 2022-09-23: 1 via TOPICAL
  Filled 2022-09-23: qty 1.8

## 2022-09-23 NOTE — ED Triage Notes (Signed)
First nurse note from EMS report:  Presents from residential treatment center. Pt there for detox x3 days. Per report pt leaned forward and fell over. No LOC. Laceration to L ear. Gauze dressing in place from EMS with bleeding controlled at this time. Last dose Librium 0000. Pt diabetic and takes metformin.   Bp 136/78 HR 76 96% RA Bgl 121

## 2022-09-23 NOTE — ED Provider Triage Note (Signed)
Emergency Medicine Provider Triage Evaluation Note  Lonnie Hernandez , a 51 y.o. male  was evaluated in triage.  Pt complains of head injury after fall at rehab, got out of bed too quickly and hit head on dresser, headache, ear pain, bleeding from left ear.  Review of Systems  Positive:  Negative:   Physical Exam  BP 123/79 (BP Location: Right Arm)   Pulse 75   Temp 98.3 F (36.8 C) (Oral)   Resp 16   Ht 5\' 9"  (1.753 m)   Wt 79.4 kg   SpO2 99%   BMI 25.84 kg/m  Gen:   Awake, no distress   Resp:  Normal effort  MSK:   Moves extremities without difficulty  Other:  Multiple abrasions on face, blood noted in left ear canal ,   Medical Decision Making  Medically screening exam initiated at 7:26 AM.  Appropriate orders placed.  Lonnie Hernandez was informed that the remainder of the evaluation will be completed by another provider, this initial triage assessment does not replace that evaluation, and the importance of remaining in the ED until their evaluation is complete.  Ct head/cspine ordered tdap   Versie Starks, PA-C 09/23/22 [phone]

## 2022-09-23 NOTE — ED Provider Notes (Signed)
[facility] Provider Note    Event Date/Time   First MD Initiated Contact with Patient 09/23/22 [phone]     (approximate)   History   Fall   HPI  Lonnie Hernandez is a 51 y.o. male history of substance abuse presents to the ER for evaluation of fall with minor injury that occurred last night.  No LOC.  States has been feeling fatigued since he was started on Librium for detox.  Denies any syncope no chest pain no numbness or tingling.  Denies any pain or discomfort at this time.     Physical Exam   Triage Vital Signs: ED Triage Vitals  Enc Vitals Group     BP 09/23/22 0721 123/79     Pulse Rate 09/23/22 0721 75     Resp 09/23/22 0721 16     Temp 09/23/22 0721 98.3 F (36.8 C)     Temp Source 09/23/22 0721 Oral     SpO2 09/23/22 0721 99 %     Weight 09/23/22 0722 175 lb (79.4 kg)     Height 09/23/22 0722 5\' 9"  (1.753 m)     Head Circumference --      Peak Flow --      Pain Score 09/23/22 0721 1     Pain Loc --      Pain Edu? --      Excl. in GC? --     Most recent vital signs: Vitals:   09/23/22 0721 09/23/22 1019  BP: 123/79 124/78  Pulse: 75 66  Resp: 16 18  Temp: 98.3 F (36.8 C)   SpO2: 99% 100%     Constitutional: Alert  Eyes: Conjunctivae are normal.  Head: Superficial abrasion to left forehead as well as left ear.  No laceration.  Hemostatic. Nose: No congestion/rhinnorhea. Mouth/Throat: Mucous membranes are moist.   Neck: Painless ROM.  Cardiovascular:   Good peripheral circulation. Respiratory: Normal respiratory effort.  No retractions.  Gastrointestinal: Soft and nontender.  Musculoskeletal:  no deformity Neurologic:  MAE spontaneously. No gross focal neurologic deficits are appreciated.  Skin:  Skin is warm, dry and intact. No rash noted. Psychiatric: Mood and affect are normal. Speech and behavior are normal.    ED Results / Procedures / Treatments   Labs (all labs ordered are listed, but only abnormal results  are displayed) Labs Reviewed  CBC WITH DIFFERENTIAL/PLATELET - Abnormal; Notable for the following components:      Result Value   RBC 4.17 (*)    Platelets 103 (*)    All other components within normal limits  COMPREHENSIVE METABOLIC PANEL - Abnormal; Notable for the following components:   Glucose, Bld 124 (*)    Albumin 3.3 (*)    Total Bilirubin 1.5 (*)    All other components within normal limits      RADIOLOGY Please see ED Course for my review and interpretation.  I personally reviewed all radiographic images ordered to evaluate for the above acute complaints and reviewed radiology reports and findings.  These findings were personally discussed with the patient.  Please see medical record for radiology report.    PROCEDURES:  Critical Care performed:   Procedures   MEDICATIONS ORDERED IN ED: Medications  Tdap (BOOSTRIX) injection 0.5 mL (0.5 mLs Intramuscular Given 09/23/22 1018)  bacitracin ointment (1 Application Topical Given 09/23/22 1019)     IMPRESSION / MDM / ASSESSMENT AND PLAN / ED COURSE  I reviewed the triage vital signs and the nursing  notes.                              Differential diagnosis includes, but is not limited to, medication effect, contusion, abrasion, laceration, SDH, IPH, withdrawal  Patient presenting to the ER for evaluation of symptoms as described above.  Based on symptoms, risk factors and considered above differential, this presenting complaint could reflect a potentially life-threatening illness therefore the patient will be placed on continuous pulse oximetry and telemetry for monitoring.  Laboratory evaluation will be sent to evaluate for the above complaints.  The imaging ordered in triage on my review and interpretation does not show any evidence of acute intracranial abnormality.  He is well-appearing does not appear to be in acute withdrawal but he seems quite subdued and I suspect that he might be slightly overmedicated with  the Librium.  He is able to ambulate.  Wound care provided.  Blood work is otherwise reassuring.  Patient stable and appropriate for outpatient follow-up.        FINAL CLINICAL IMPRESSION(S) / ED DIAGNOSES   Final diagnoses:  Fall, initial encounter  Abrasion     Rx / DC Orders   ED Discharge Orders     None        Note:  This document was prepared using Dragon voice recognition software and may include unintentional dictation errors.    Merlyn Lot, MD 09/23/22 [phone]

## 2022-09-23 NOTE — ED Notes (Signed)
Patient discharged to home per MD order. Patient in stable condition, and deemed medically cleared by ED provider for discharge. Discharge instructions reviewed with patient/family using "Teach Back"; verbalized understanding of medication education and administration, and information about follow-up care. Denies further concerns. ° °

## 2022-09-23 NOTE — ED Triage Notes (Signed)
[facility] for [facility] due to alcohol abuse. Reports after awakening he bent over to get a sweatshirt and lost all balance and fell over. Denies LOC. Left ear laceration and abrasions noted to left side of face. Last dose of Librium midnight. Reports he has been at rehab since Tuesday morning.   Hx diabetes and takes metformin

## 2022-09-23 NOTE — Discharge Instructions (Signed)
I would recommend the facility consider decreasing your librium dose as you may be receiving too much.  Please return for any additional, questions or concerns.

## 2023-04-24 ENCOUNTER — Other Ambulatory Visit: Payer: Self-pay | Admitting: Internal Medicine

## 2023-04-24 DIAGNOSIS — Z87898 Personal history of other specified conditions: Secondary | ICD-10-CM

## 2023-04-24 DIAGNOSIS — D696 Thrombocytopenia, unspecified: Secondary | ICD-10-CM

## 2023-04-25 ENCOUNTER — Ambulatory Visit
Admission: RE | Admit: 2023-04-25 | Discharge: 2023-04-25 | Disposition: A | Discharge status: Home / Self Care | Payer: 59 | Source: Ambulatory Visit | Attending: Internal Medicine | Admitting: Internal Medicine

## 2023-04-25 DIAGNOSIS — D696 Thrombocytopenia, unspecified: Secondary | ICD-10-CM | POA: Diagnosis present

## 2023-04-25 DIAGNOSIS — Z87898 Personal history of other specified conditions: Secondary | ICD-10-CM

## 2023-04-28 ENCOUNTER — Encounter: Payer: Self-pay | Admitting: Orthopedic Surgery

## 2023-04-28 ENCOUNTER — Other Ambulatory Visit: Payer: Self-pay | Admitting: Orthopedic Surgery

## 2023-04-28 DIAGNOSIS — M67912 Unspecified disorder of synovium and tendon, left shoulder: Secondary | ICD-10-CM

## 2023-04-28 DIAGNOSIS — G8929 Other chronic pain: Secondary | ICD-10-CM

## 2023-08-06 ENCOUNTER — Ambulatory Visit
Admission: EM | Admit: 2023-08-06 | Discharge: 2023-08-06 | Disposition: A | Discharge status: Home / Self Care | Payer: No Typology Code available for payment source | Attending: Emergency Medicine | Admitting: Emergency Medicine

## 2023-08-06 ENCOUNTER — Encounter: Payer: Self-pay | Admitting: Emergency Medicine

## 2023-08-06 DIAGNOSIS — Z20822 Contact with and (suspected) exposure to covid-19: Secondary | ICD-10-CM | POA: Insufficient documentation

## 2023-08-06 DIAGNOSIS — J069 Acute upper respiratory infection, unspecified: Secondary | ICD-10-CM | POA: Diagnosis present

## 2023-08-06 LAB — SARS CORONAVIRUS 2 BY RT PCR: SARS Coronavirus 2 by RT PCR: POSITIVE — AB

## 2023-08-06 MED ORDER — CETIRIZINE HCL 10 MG PO TABS: 10.0000 mg | ORAL_TABLET | Freq: Every day | ORAL | 0 refills | Status: AC

## 2023-08-06 MED ORDER — PROMETHAZINE-DM 6.25-15 MG/5ML PO SYRP: 5.0000 mL | ORAL_SOLUTION | Freq: Four times a day (QID) | ORAL | 0 refills | Status: AC | PRN

## 2023-08-06 MED ORDER — PSEUDOEPHEDRINE HCL 60 MG PO TABS: 60.0000 mg | ORAL_TABLET | Freq: Three times a day (TID) | ORAL | 0 refills | Status: AC | PRN

## 2023-08-06 NOTE — ED Triage Notes (Signed)
Patient has been around his father who has COVID.  Patient c/o sore throat, cough, runny nose, bodyaches and headache that started 2 days.  Patient unsure of fevers.

## 2023-08-06 NOTE — ED Provider Notes (Signed)
[facility]    CSN: [id number] Arrival date & time: 08/06/23  6213      History   Chief Complaint Chief Complaint  Patient presents with   Cough   Sore Throat    HPI Lonnie Hernandez is a 52 y.o. male.   52 year old male who presents to urgent care with complaints of severe body aches, congestion, cough with subjective fevers and chills.  He reports that he started having symptoms on Friday of a sore throat with a headache.  Last night the symptoms came much worse.  This last week he has been in and out of the hospital with his father who has been diagnosed with COVID-pneumonia.  He has not taken a COVID test at home.  He was exposed to his father prior to him being hospitalized.  He denies nausea, vomiting, diarrhea, chest pain or shortness of breath.     Cough Associated symptoms: chills, fever and sore throat   Associated symptoms: no chest pain, no ear pain, no rash and no shortness of breath   Sore Throat Pertinent negatives include no chest pain, no abdominal pain and no shortness of breath.    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) 09/27/2016   Essential hypertension 09/27/2016   Swelling of right lower extremity 09/27/2016    Past Surgical History:  Procedure Laterality Date   PERIPHERAL VASCULAR CATHETERIZATION Right 07/14/2016   Procedure: Thrombectomy;  Surgeon: Annice Needy, MD;  Location: [facility];  Service: Cardiovascular;  Laterality: Right;   PERIPHERAL VASCULAR CATHETERIZATION N/A 07/14/2016   Procedure: IVC Filter Insertion;  Surgeon: Annice Needy, MD;  Location: [facility];  Service: Cardiovascular;  Laterality: N/A;   PERIPHERAL VASCULAR  CATHETERIZATION N/A 10/03/2016   Procedure: IVC Filter Removal;  Surgeon: Annice Needy, MD;  Location: [facility];  Service: Cardiovascular;  Laterality: N/A;   XI ROBOTIC ASSISTED INGUINAL HERNIA REPAIR WITH MESH Left 12/24/2020   Procedure: XI ROBOTIC ASSISTED INGUINAL HERNIA REPAIR WITH MESH;  Surgeon: Sung Amabile, DO;  Location: [facility] ORS;  Service: General;  Laterality: Left;       Home Medications    Prior to Admission medications   Medication Sig Start Date End Date Taking? Authorizing Provider  amLODipine (NORVASC) 2.5 MG tablet Take 2.5 mg by mouth daily.   Yes [provider]  aspirin 81 MG chewable tablet Chew 81 mg by mouth daily.   Yes [provider]  losartan (COZAAR) 100 MG tablet Take 100 mg by mouth daily. 06/21/16  Yes [provider]  busPIRone (BUSPAR) 5 MG tablet Take 5 mg by mouth 2 (two) times daily.    [provider]  carvedilol (COREG CR) 20 MG 24 hr capsule Take 20 mg by mouth daily. 11/23/20   [provider]  citalopram (CELEXA) 20 MG tablet Take 20 mg by mouth daily.    [provider]  HYDROcodone-acetaminophen (NORCO) 5-325 MG tablet Take 1 tablet by mouth every 6 (six) hours as needed for up to 6 doses for moderate pain. 12/24/20   Tonna Boehringer, Isami, DO  ibuprofen (ADVIL) 800 MG tablet Take 1 tablet (800 mg total)  by mouth every 8 (eight) hours as needed for mild pain or moderate pain. 12/24/20   Tonna Boehringer, Isami, DO  metFORMIN (GLUCOPHAGE) 500 MG tablet Take 500 mg by mouth daily with breakfast.    [provider]  ondansetron (ZOFRAN ODT) 4 MG disintegrating tablet Take 1 tablet (4 mg total) by mouth every 8 (eight) hours as needed for nausea or vomiting. 12/28/20   Tonna Boehringer, Isami, DO  sildenafil (VIAGRA) 100 MG tablet Take 100 mg by mouth as needed for erectile dysfunction. 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   Review of Systems Review of Systems  Constitutional:  Positive for activity change, appetite change, chills, fatigue and fever.  HENT:  Positive for congestion and sore throat. Negative for ear pain.   Eyes:  Negative for pain and visual disturbance.  Respiratory:  Positive for cough. Negative for shortness of breath.   Cardiovascular:  Negative for chest pain and palpitations.  Gastrointestinal:  Negative for abdominal pain, diarrhea, nausea and vomiting.  Genitourinary:  Negative for dysuria and hematuria.  Musculoskeletal:  Positive for arthralgias. Negative for back pain.  Skin:  Negative for color change and rash.  Neurological:  Negative for seizures and syncope.  All other systems reviewed and are negative.    Physical Exam Triage Vital Signs ED Triage Vitals  Encounter Vitals Group     BP 08/06/23 1019 (!) 139/90     Systolic BP Percentile --      Diastolic BP Percentile --      Pulse Rate 08/06/23 1019 99     Resp 08/06/23 1019 15     Temp 08/06/23 1019 99.5 F (37.5 C)     Temp Source 08/06/23 1019 Oral     SpO2 08/06/23 1019 98 %     Weight 08/06/23 1018 170 lb (77.1 kg)     Height 08/06/23 1018 5\' 9"  (1.753 m)     Head Circumference --      Peak Flow --      Pain Score 08/06/23 1017 6     Pain Loc --      Pain Education --      Exclude from Growth Chart --    No data found.  Updated Vital  Signs BP (!) 139/90 (BP Location: Right Arm)   Pulse 99   Temp 99.5 F (37.5 C) (Oral)   Resp 15   Ht 5\' 9"  (1.753 m)   Wt 170 lb (77.1 kg)   SpO2 98%   BMI 25.10 kg/m   Visual Acuity Right Eye Distance:   Left Eye Distance:   Bilateral Distance:    Right Eye Near:   Left Eye Near:  Bilateral Near:     Physical Exam Vitals and nursing note reviewed.  Constitutional:      General: He is not in acute distress.    Appearance: He is well-developed.  HENT:     Head: Normocephalic and atraumatic.     Right Ear: Tympanic membrane normal. No tenderness.     Left Ear: Tympanic membrane normal. No tenderness.     Nose: Congestion present.     Mouth/Throat:     Mouth: Mucous membranes are moist.     Pharynx: Oropharynx is clear. No pharyngeal swelling or posterior oropharyngeal erythema.     Tonsils: No tonsillar exudate.  Eyes:     Conjunctiva/sclera: Conjunctivae normal.  Cardiovascular:     Rate and Rhythm: Normal rate and regular rhythm.     Heart sounds: No murmur heard. Pulmonary:     Effort: Pulmonary effort is normal. No respiratory distress.     Breath sounds: Normal breath sounds. No wheezing.  Chest:     Chest wall: No tenderness.  Abdominal:     Palpations: Abdomen is soft.     Tenderness: There is no abdominal tenderness.  Musculoskeletal:        General: No swelling.     Cervical back: Neck supple.  Skin:    General: Skin is warm and dry.     Capillary Refill: Capillary refill takes less than 2 seconds.  Neurological:     Mental Status: He is alert.  Psychiatric:        Mood and Affect: Mood normal.      UC Treatments / Results  Labs (all labs ordered are listed, but only abnormal results are displayed) Labs Reviewed  SARS CORONAVIRUS 2 BY RT PCR    EKG   Radiology No results found.  Procedures Procedures (including critical care time)  Medications Ordered in UC Medications - No data to display  Initial Impression / Assessment and  Plan / UC Course  I have reviewed the triage vital signs and the nursing notes.  Pertinent labs & imaging results that were available during my care of the patient were reviewed by me and considered in my medical decision making (see chart for details).     Viral upper respiratory tract infection: Patient's symptoms are consistent with a viral upper respiratory tract infection however he has been exposed to COVID via his father who is in the hospital with COVID-pneumonia.  The COVID test will take 1 to 2 days to get results and we will contact him if this is positive.  For now we will start him on Promethazine DM for the cough as well as Zyrtec and over-the-counter Sudafed.  Note given to the patient for work to return on 8/29.  Patient should return to clinic if his symptoms worsen or fail to improve Final Clinical Impressions(s) / UC Diagnoses   Final diagnoses:  None   Discharge Instructions   None    ED Prescriptions   None    PDMP not reviewed this encounter.   Landis Martins, [place] 08/06/23 1144

## 2023-08-06 NOTE — Discharge Instructions (Signed)
Likely viral upper respiratory infection.  Start promethazine DM 5 mL up to 4 times daily to help with cough.  May use Sudafed for congestion every 8 hours.  May also use Zyrtec to help with upper respiratory symptoms.  Note for work to return on Thursday.  COVID test will be back in 1 to 2 days and results will be available online and if they are positive we will contact you.

## 2024-01-10 ENCOUNTER — Other Ambulatory Visit: Payer: Self-pay | Admitting: Gastroenterology

## 2024-01-10 DIAGNOSIS — K7460 Unspecified cirrhosis of liver: Secondary | ICD-10-CM
